# Patient Record
Sex: Female | Born: 1955 | Race: White | Hispanic: No | Marital: Married
Health system: Midwestern US, Community
[De-identification: ages and names within clinical notes are randomized; demographics above are authoritative.]

## PROBLEM LIST (undated history)

## (undated) DIAGNOSIS — R918 Other nonspecific abnormal finding of lung field: Secondary | ICD-10-CM

## (undated) DIAGNOSIS — R1013 Epigastric pain: Secondary | ICD-10-CM

## (undated) DIAGNOSIS — K769 Liver disease, unspecified: Principal | ICD-10-CM

## (undated) DIAGNOSIS — L299 Pruritus, unspecified: Secondary | ICD-10-CM

## (undated) DIAGNOSIS — G629 Polyneuropathy, unspecified: Secondary | ICD-10-CM

## (undated) DIAGNOSIS — R911 Solitary pulmonary nodule: Secondary | ICD-10-CM

## (undated) DIAGNOSIS — R0609 Other forms of dyspnea: Secondary | ICD-10-CM

## (undated) DIAGNOSIS — E7800 Pure hypercholesterolemia, unspecified: Secondary | ICD-10-CM

## (undated) DIAGNOSIS — E278 Other specified disorders of adrenal gland: Secondary | ICD-10-CM

---

## 2014-04-15 ENCOUNTER — Inpatient Hospital Stay
Admit: 2014-04-15 | Discharge: 2014-04-15 | Disposition: A | Discharge status: Home / Self Care | Payer: PRIVATE HEALTH INSURANCE | Attending: Emergency Medicine

## 2014-04-15 DIAGNOSIS — R51 Headache: Secondary | ICD-10-CM

## 2014-04-15 LAB — METABOLIC PANEL, COMPREHENSIVE
A-G Ratio: 1.3 (ref 1.1–2.2)
ALT (SGPT): 21 U/L (ref 12–78)
AST (SGOT): 24 U/L (ref 15–37)
Albumin: 4.2 g/dL (ref 3.5–5.0)
Alk. phosphatase: 96 U/L (ref 45–117)
Anion gap: 8 mmol/L (ref 5–15)
BUN/Creatinine ratio: 19 (ref 12–20)
BUN: 20 MG/DL (ref 6–20)
Bilirubin, total: 0.4 MG/DL (ref 0.2–1.0)
CO2: 24 mmol/L (ref 21–32)
Calcium: 8.9 MG/DL (ref 8.5–10.1)
Chloride: 107 mmol/L (ref 97–108)
Creatinine: 1.08 MG/DL — ABNORMAL HIGH (ref 0.55–1.02)
GFR est AA: >60 mL/min/{1.73_m2} (ref >60)
GFR est non-AA: 52 mL/min/{1.73_m2} — ABNORMAL LOW (ref >60)
Globulin: 3.3 g/dL (ref 2.0–4.0)
Glucose: 77 mg/dL (ref 65–100)
Potassium: 4.3 mmol/L (ref 3.5–5.1)
Protein, total: 7.5 g/dL (ref 6.4–8.2)
Sodium: 139 mmol/L (ref 136–145)

## 2014-04-15 LAB — CBC WITH AUTOMATED DIFF
ABS. BASOPHILS: 0.1 10*3/uL (ref 0.0–0.1)
ABS. EOSINOPHILS: 0.3 10*3/uL (ref 0.0–0.4)
ABS. LYMPHOCYTES: 2.2 10*3/uL (ref 0.8–3.5)
ABS. MONOCYTES: 0.6 10*3/uL (ref 0.0–1.0)
ABS. NEUTROPHILS: 5.5 10*3/uL (ref 1.8–8.0)
BASOPHILS: 1 % (ref 0–1)
EOSINOPHILS: 3 % (ref 0–7)
HCT: 44.2 % (ref 35.0–47.0)
HGB: 14.5 g/dL (ref 11.5–16.0)
LYMPHOCYTES: 25 % (ref 12–49)
MCH: 30.7 PG (ref 26.0–34.0)
MCHC: 32.8 g/dL (ref 30.0–36.5)
MCV: 93.4 FL (ref 80.0–99.0)
MONOCYTES: 7 % (ref 5–13)
NEUTROPHILS: 64 % (ref 32–75)
PLATELET: 278 10*3/uL (ref 150–400)
RBC: 4.73 M/uL (ref 3.80–5.20)
RDW: 12.9 % (ref 11.5–14.5)
WBC: 8.6 10*3/uL (ref 3.6–11.0)

## 2014-04-15 NOTE — ED Provider Notes (Signed)
HPI Comments: 59 y.o. female with past medical history significant for arthritis and pancreatitis who presents from home with chief complaint of headache.  Pt states sudden onset "four days ago" of episodic HA.  Pt reports having sharp R-sided HA that radiates to her R-neck and R-face.  Pt reports having worsened pain at night.  Pt states she has been taking Augmentin ("started yesterday"), anti-histamine, and using a NetiPot with minimal relief.   Pt states that her vision feels "distant" and she has some "facial dryness" with accompanying forehead redness.  Pt states these symptoms are new and not previously experienced in the past.  Pt denies having sore throat, cough, nausea, vomiting, or diarrhea.  There are no other acute medical concerns at this time.    Social hx: Tobacco: No, Alcohol: No, Occupation: Procedure nurse at [employer]    PCP: No primary care provider on file.    Note written by Juan Quam. Philippi, Neurosurgeon, as dictated by Thornton Papas, MD 4:45 PM          The history is provided by the patient.        Past Medical History:   Diagnosis Date   ??? Arthritis    ??? Pancreatitis      when pt was 59 y.o       Past Surgical History:   Procedure Laterality Date   ??? Hx knee arthroscopy Right    ??? Hx partial hysterectomy     ??? Hx tonsillectomy     ??? Hx wisdom teeth extraction           History reviewed. No pertinent family history.    History     Social History   ??? Marital Status: SINGLE     Spouse Name: N/A     Number of Children: N/A   ??? Years of Education: N/A     Occupational History   ??? Not on file.     Social History Main Topics   ??? Smoking status: Never Smoker    ??? Smokeless tobacco: Not on file   ??? Alcohol Use: No   ??? Drug Use: Not on file   ??? Sexual Activity: Not on file     Other Topics Concern   ??? Not on file     Social History Narrative   ??? No narrative on file           ALLERGIES: Doxycycline and Macrobid      Review of Systems    Constitutional: Negative for fever, chills and diaphoresis.   HENT: Negative for congestion, postnasal drip, rhinorrhea and sore throat.         Positive for facial "dryness".   Eyes: Positive for visual disturbance ("distant"). Negative for photophobia, discharge and redness.   Respiratory: Negative for cough, chest tightness, shortness of breath and wheezing.    Cardiovascular: Negative for chest pain, palpitations and leg swelling.   Gastrointestinal: Negative for nausea, vomiting, abdominal pain, diarrhea, constipation, blood in stool and abdominal distention.   Genitourinary: Negative for dysuria, urgency, frequency, hematuria and difficulty urinating.   Musculoskeletal: Negative for myalgias, back pain, joint swelling and arthralgias.   Skin: Negative for color change and rash.   Neurological: Positive for headaches. Negative for dizziness, speech difficulty, weakness, light-headedness and numbness.   Psychiatric/Behavioral: Negative for confusion. The patient is not nervous/anxious.    All other systems reviewed and are negative.      Filed Vitals:    04/15/14 1620  BP: 162/96   Pulse: 95   Temp: 97.8 ??F (36.6 ??C)   Resp: 16   Height: 5\' 8"  (1.727 m)   Weight: 70.308 kg (155 lb)   SpO2: 97%            Physical Exam   Constitutional: She is oriented to person, place, and time. She appears well-developed and well-nourished. No distress.   HENT:   Head: Normocephalic and atraumatic.   Right Ear: External ear normal.   Left Ear: External ear normal.   Nose: Nose normal.   Mouth/Throat: Oropharynx is clear and moist.   Eyes: Conjunctivae and EOM are normal. Pupils are equal, round, and reactive to light. No scleral icterus.   Neck: Neck supple. No JVD present. No tracheal deviation present. No thyromegaly present.   Cardiovascular: Normal rate, regular rhythm and normal heart sounds.  Exam reveals no gallop and no friction rub.    No murmur heard.   Pulmonary/Chest: Effort normal and breath sounds normal. No respiratory distress. She has no wheezes. She has no rales. She exhibits no tenderness.   Abdominal: Soft. Bowel sounds are normal. She exhibits no distension and no mass. There is no tenderness. There is no rebound and no guarding.   Musculoskeletal: Normal range of motion. She exhibits no edema or tenderness.   Lymphadenopathy:     She has no cervical adenopathy.   Neurological: She is alert and oriented to person, place, and time. No cranial nerve deficit. Coordination normal.   Skin: Skin is warm and dry. No rash noted. She is not diaphoretic. No erythema.   Psychiatric: She has a normal mood and affect. Her behavior is normal. Judgment and thought content normal.   Nursing note and vitals reviewed.  Note written by Juan QuamZachary A. Philippi, Neurosurgeoncribe, as dictated by Thornton PapasLuis F Ismail Graziani, MD 4:45 PM       MDM  Number of Diagnoses or Management Options  Acute nonintractable headache, unspecified headache type:   Cervical paraspinal muscle spasm:   Diagnosis management comments: Madia:  Patient he'll female presenting to the emergency department with a headache more so on the right side radiating down into her neck however not in the posterior aspect bilaterally. She describes a subjective visual disturbance R. there has been no fever chills, nausea vomiting, numbness weakness or paresthesias. She has tried over-the-counter remedies for sinusitis including the neddy pot, she has a prescription for Augmentin which was left over and she started taking this yesterday.    The differential includes sinusitis, doubt intracerebral or vascular event,    Plan of care will be baseline labs to include a sedimentation rate and a head CT scan.      Procedures

## 2014-04-15 NOTE — ED Notes (Signed)
C/o sharp pains radiating from right side of head into neck that started Sunday.  C/o sensitive face and scalp to touch. C/o reddened area to center forehead into scalp as well.

## 2014-04-15 NOTE — ED Notes (Signed)
Sent ordered labs.  Pt resting comfortably, no concerns at this time.

## 2014-04-15 NOTE — ED Notes (Signed)
Pt verbalized understanding of discharge instructions.  Pt ambulated off unit.

## 2014-04-15 NOTE — ED Notes (Signed)
Pt comes to ED with report of headache that radiates down to the right side of her neck, onset this past Sunday.  Pt also reports areas of redness on forehead.

## 2014-04-16 LAB — SED RATE (ESR): Sed rate, automated: 5 mm/hr (ref 0–30)

## 2014-04-16 MED ORDER — METHOCARBAMOL 750 MG TAB
750 mg | ORAL_TABLET | Freq: Four times a day (QID) | ORAL | Status: AC
Start: 2014-04-16 — End: 2014-04-20

## 2014-04-16 MED ORDER — TRAMADOL 50 MG TAB
50 mg | ORAL_TABLET | Freq: Four times a day (QID) | ORAL | Status: DC | PRN
Start: 2014-04-16 — End: 2016-03-06

## 2015-01-27 ENCOUNTER — Ambulatory Visit: Admit: 2015-01-27 | Payer: PRIVATE HEALTH INSURANCE | Attending: Neurology

## 2015-01-27 DIAGNOSIS — R413 Other amnesia: Secondary | ICD-10-CM

## 2015-01-27 MED ORDER — GABAPENTIN 100 MG CAP
100 mg | ORAL_CAPSULE | Freq: Every evening | ORAL | 5 refills | Status: DC
Start: 2015-01-27 — End: 2015-07-28

## 2015-01-27 NOTE — Progress Notes (Signed)
NEUROLOGY NEW PATIENT CONSULTATION    REFERRED BY:  Adine Madura, MD    CHIEF COMPLAINT:  Memory loss    HISTORY OF PRESENT ILLNESS    HISTORY PROVIDED BY:  Patient    Suzanne Norman is a 59 y.o. female who I am asked to see in consultation for memory loss. She says through the years she has had issues with this. She reports her mom had this issue when she was in her 32s. She says she is in a panic while trying to do her job. She can't remember names. She feels like she has attention deficit. She feels like she can't see the details. She did try ADD meds but couldn't tolerate this in the past.    She does all ADLs. Remembering names is not quick anymore.    She is driving. She reports she gets lost easily. She needs to stay in routine places for her.   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 She has had issues all her life. She is doing fine at work and no one has noticed an issue.   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 She has pain and the desire to itch it. Neurontin was suggested but she hasn't tried it. She reports this all the time and it will make her want to cry or scream.   She just CTS on left by Dr. Pam Drown.   She just had labs done today at [place]. She got B12, TSH, and autoimmune labs.   FH of autoimmune issues.    CT head Jan 2016 due to head pain. Her headaches have improved.   She reports severe pain in the legs with lactic acid buildup after exercise.       PMH  Past Medical History   Diagnosis Date   ??? Arthritis    ??? Pancreatitis      when pt was 59 y.o       [place]  Social History     Social History   ??? Marital status: MARRIED     Spouse name: N/A   ??? Number of children: N/A   ??? Years of education: N/A     Social History Main Topics   ??? Smoking status: Never Smoker   ??? Smokeless tobacco: None   ??? Alcohol use No   ??? Drug use: None   ??? Sexual activity: Not Asked     Other Topics Concern   ??? None     Social History Narrative       FH   History reviewed. No pertinent family history.    ALLERGIES  Allergies   Allergen Reactions   ??? Doxycycline Vertigo   ??? Macrobid [Nitrofurantoin Monohyd/M-Cryst] Nausea and Vomiting       CURRENT MEDS  Current Outpatient Prescriptions   Medication Sig Dispense Refill   ??? naproxen sodium (ALEVE) 220 mg tablet Take 500 mg by mouth as needed.     ??? FEXOFENADINE HCL (WAL-FEX ALLERGY PO) Take  by mouth daily as needed.     ??? traMADol (ULTRAM) 50 mg tablet Take 1 Tab by mouth every six (6) hours as needed for Pain. Max Daily Amount: 200 mg. 20 Tab 0       REVIEW OF SYSTEMS:     Y  N       Y  N  Y  N   Y  N    AIDS  Falls    Memory Loss     Shortness of breath    Anxiety            Fatigue   Muscle Pain           Skipped beats    Chest Pain     Frequent HA   Ms Weakness        Snoring    Constipation  Hearing loss   Nause/Vomiting     Stomach Pain    Cough           Hepatitis   Neuropathy            Swallowing difficulty    Depression   Incontinence   Poor appetite         Vertigo    Diarrhea         Joint Pain   Rash                      Visual disturbances    Fainting          Leg Swelling   Ringing ears          Weight changes      Unable to obtain  ROS due to  mental status change  sedated   intubated          PREVIOUS WORKUP  IMAGING: CT head: Negative  (I personally reviewed these images in PACS and this is my impression)    LABS  Results for orders placed or performed during the hospital encounter of 04/15/14   CBC WITH AUTOMATED DIFF   Result Value Ref Range    WBC 8.6 3.6 - 11.0 K/uL    RBC 4.73 3.80 - 5.20 M/uL    HGB 14.5 11.5 - 16.0 g/dL    HCT 44.2 35.0 - 47.0 %    MCV 93.4 80.0 - 99.0 FL    MCH 30.7 26.0 - 34.0 PG    MCHC 32.8 30.0 - 36.5 g/dL    RDW 12.9 11.5 - 14.5 %    PLATELET 278 150 - 400 K/uL    NEUTROPHILS 64 32 - 75 %    LYMPHOCYTES 25 12 - 49 %    MONOCYTES 7 5 - 13 %    EOSINOPHILS 3 0 - 7 %    BASOPHILS 1 0 - 1 %    ABS. NEUTROPHILS 5.5 1.8 - 8.0 K/UL     ABS. LYMPHOCYTES 2.2 0.8 - 3.5 K/UL    ABS. MONOCYTES 0.6 0.0 - 1.0 K/UL    ABS. EOSINOPHILS 0.3 0.0 - 0.4 K/UL    ABS. BASOPHILS 0.1 0.0 - 0.1 K/UL   METABOLIC PANEL, COMPREHENSIVE   Result Value Ref Range    Sodium 139 136 - 145 mmol/L    Potassium 4.3 3.5 - 5.1 mmol/L    Chloride 107 97 - 108 mmol/L    CO2 24 21 - 32 mmol/L    Anion gap 8 5 - 15 mmol/L    Glucose 77 65 - 100 mg/dL    BUN 20 6 - 20 MG/DL    Creatinine 1.08 (H) 0.55 - 1.02 MG/DL    BUN/Creatinine ratio 19 12 - 20      GFR est AA >60 >60 ml/min/1.52m    GFR est non-AA 52 (L) >60 ml/min/1.761m   Calcium 8.9 8.5 - 10.1 MG/DL    Bilirubin, total 0.4 0.2 - 1.0 MG/DL    ALT 21  12 - 78 U/L    AST 24 15 - 37 U/L    Alk. phosphatase 96 45 - 117 U/L    Protein, total 7.5 6.4 - 8.2 g/dL    Albumin 4.2 3.5 - 5.0 g/dL    Globulin 3.3 2.0 - 4.0 g/dL    A-G Ratio 1.3 1.1 - 2.2     SED RATE (ESR)   Result Value Ref Range    Sed rate, automated 5 0 - 30 mm/hr       PHYSICAL EXAM  Visit Vitals   ??? BP 140/70   ??? Pulse 84   ??? Ht 5' 8"  (1.727 m)   ??? Wt 152 lb (68.9 kg)   ??? SpO2 98%   ??? BMI 23.11 kg/m2     General:  Alert, cooperative, no distress.   Head:  Normocephalic, without obvious abnormality, atraumatic.   Eyes:  Conjunctivae/corneas clear. Pupils equal, round, reactive to light. Extraocular movements intact, VFF, NO papilledema   Lungs:  Heart:   Non labored breathing  Regular rate and rhythm, no carotid bruits   Abdomen:   Soft, non-distended   Extremities: Extremities normal, atraumatic, no cyanosis or edema.   Pulses: 2+ and symmetric all extremities.   Skin: Skin color, texture, turgor normal. No rashes or lesions.   Neurologic:  Gen: Attention normal             Language: naming, repetition, fluency normal             Memory: intact recent and remote memory  Cranial Nerves:  I: smell Not tested   II: visual fields Full to confrontation   II: pupils Equal, round, reactive to light   II: optic disc No papilledema   III,VII: ptosis none    III,IV,VI: extraocular muscles  Full ROM   V: mastication normal   V: facial light touch sensation  normal   VII: facial muscle function   symmetric   VIII: hearing symmetric   IX: soft palate elevation  normal   XI: trapezius strength  5/5   XI: sternocleidomastoid strength 5/5   XI: neck flexion strength  5/5   XII: tongue  midline     Motor: normal bulk and tone, no tremor              Strength: 5/5 all four extremities, except 4/5 LUE  Sensory: intact to LT, PP, vibration, and temperature  Coordination: FTN intact, Rhomberg negative  Gait: normal gait including tandem   Reflexes: 2+ throughout       IMPRESSION  Suzanne Norman is a 59 y.o. female who presents for evaluation of memory loss. She does have history of ADD. She also has family history of dementia. Will do eval with MRI and neuropsych. Will wait for labs ordered by PCP.    RECOMMENDATIONS  1. Referral to neuropsych  2. MRI brain w/wo contrast  3. Discussed medications  4. No labs needed but she will send [place] the labs she has gotten today (B12, TSH, CPK, autoimmune labs, Lyme)  5. Start gabapentin 100 mg daily at bedtime for nerve pain  6. Discussed dementia versus ADD    FU after neuropsych testing    Janan Ridge, MD    CC: Adine Madura, MD  Fax: [phone]     This note will not be viewable in [place].

## 2015-01-27 NOTE — Patient Instructions (Signed)
PRESCRIPTION REFILL POLICY    [facility]   Statement to Patients  June 24, 2012      In an effort to ensure the large volume of patient prescription refills is processed in the most efficient and expeditious manner, we are asking our patients to assist us by calling your Pharmacy for all prescription refills, this will include also your  Mail Order Pharmacy. The pharmacy will contact our office electronically to continue the refill process.    Please do not wait until the last minute to call your pharmacy. We need at least 48 hours (2days) to fill prescriptions. We also encourage you to call your pharmacy before going to pick up your prescription to make sure it is ready.     With regard to controlled substance prescription refill requests (narcotic refills) that need to be picked up at our office, we ask your cooperation by providing us with at least 72 hours (3days) notice that you will need a refill.    We will not refill narcotic prescription refill requests after 4:00pm on any weekday, Monday through Thursday, or after 2:00pm on Fridays, or on the weekends.      We encourage everyone to explore another way of getting your prescription refill request processed using MyChart, our patient web portal through our electronic medical record system. MyChart is an efficient and effective way to communicate your medication request directly to the office and  downloadable as an app on your smart phone . MyChart also features a review functionality that allows you to view your medication list as well as leave messages for your physician. Are you ready to get connected? If so please review the attatched instructions or speak to any of our staff to get you set up right away!    Thank you so much for your cooperation. Should you have any questions please contact our Practice Administrator.    The Physicians and Staff,  [facility]

## 2015-01-27 NOTE — Progress Notes (Signed)
Referral faxed to Dr. Khawaja

## 2015-02-04 NOTE — Telephone Encounter (Signed)
Patient contact her insr and Dr.Khawaja is out of network, pt would like the name of another doctor in her network, please call pt

## 2015-02-04 NOTE — Telephone Encounter (Signed)
Left message for patient to contact her insurance company and see what Neuropsych physician they will cover and call us with that name

## 2015-02-07 ENCOUNTER — Ambulatory Visit: Payer: PRIVATE HEALTH INSURANCE

## 2015-02-14 NOTE — Telephone Encounter (Signed)
-----   Message from Renda Rollsichard R Barnett sent at 02/14/2015 11:03 AM EST -----  Pt's mri is denied. Please let me know if by chance the doctor would like to do a peer to peer. If I don't hear anything by 2:00 I will take the pt off the schedule. Thanks!   Richard    [phone], denied per [employer] website, reason for denial - Based on   [employer] Guidelines, we are unable to approve the   requested study. Guidelines support advanced imaging after there has   been a clinical diagnosis of dementia. A diagnosis can be made by a   detailed history documenting memory loss and cognitive impairment   affecting daily activities, and/or an abnormal mental status exam   such as the Mini Mental Status Exam, Montreal Cognitive Assessment,   Memory Impairment Screen, or other neuropsychological testing. The   clinical information provided does not meet these criteria and,   therefore, advanced imaging is not indicated at this time. for peer   to peer md can call [phone] and reference the case number   [id number]

## 2015-02-14 NOTE — Telephone Encounter (Signed)
I'm sorry , Dr. Piedad Climesodden does not do Peer to peer

## 2015-02-14 NOTE — Telephone Encounter (Signed)
Dr. Piedad Climesodden does not do peer to peer.

## 2015-02-14 NOTE — Telephone Encounter (Signed)
-----   Message from Richard R Barnett sent at 02/14/2015 11:03 AM EST -----  Pt's mri is denied. Please let me know if by chance the doctor would like to do a peer to peer. If I don't hear anything by 2:00 I will take the pt off the schedule. Thanks!   Richard    [id number], denied per [employer] website, reason for denial - Based on   [employer] Guidelines, we are unable to approve the   requested study. Guidelines support advanced imaging after there has   been a clinical diagnosis of dementia. A diagnosis can be made by a   detailed history documenting memory loss and cognitive impairment   affecting daily activities, and/or an abnormal mental status exam   such as the Mini Mental Status Exam, Montreal Cognitive Assessment,   Memory Impairment Screen, or other neuropsychological testing. The   clinical information provided does not meet these criteria and,   therefore, advanced imaging is not indicated at this time. for peer   to peer md can call [phone] and reference the case number   [id number]

## 2015-02-15 ENCOUNTER — Inpatient Hospital Stay: Payer: PRIVATE HEALTH INSURANCE | Attending: Neurology

## 2015-05-25 ENCOUNTER — Encounter: Attending: Clinical Neuropsychologist

## 2015-07-28 MED ORDER — GABAPENTIN 100 MG CAP
100 mg | ORAL_CAPSULE | ORAL | 5 refills | Status: DC
Start: 2015-07-28 — End: 2016-09-04

## 2016-03-06 ENCOUNTER — Encounter: Admit: 2016-03-06 | Discharge: 2016-03-06 | Payer: PRIVATE HEALTH INSURANCE

## 2016-03-06 ENCOUNTER — Ambulatory Visit: Admit: 2016-03-06 | Payer: PRIVATE HEALTH INSURANCE | Attending: Internal Medicine

## 2016-03-06 ENCOUNTER — Encounter: Admit: 2016-03-06

## 2016-03-06 DIAGNOSIS — Z0000 Encounter for general adult medical examination without abnormal findings: Secondary | ICD-10-CM

## 2016-03-06 NOTE — Progress Notes (Signed)
Images obtained

## 2016-03-06 NOTE — Progress Notes (Signed)
HISTORY OF PRESENT ILLNESS  Suzanne Norman is a 60 y.o. female.  HPI   Pt is here to establish care.    BP is 138/87    Wt is 163 lbs today  Discussed diet and weight loss     Pt had labs completed in 2016  Will get labs today    Pt follows with Dr. 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    Pt c/o one episode of tinnitus   However, her sx are not constant     Pt c/o SOB when exercising  Pt states that "there is no breath" when swimming, talking, jogging or lifting weights  Pt had labs completed for her sx in 2016  Per pt, [facility] in 2017 was unremarkable   Pt states that she completed a stress test, as she had some PVCs  Will get EKG  Ordered CXR  Ordered stress-ECHO    Pt c/o some hypoglycemic episodes  Pt states that she gets "a low BS feeling"  Pt attributes her feeling to eating too many sweets    Pt c/o pain and vaginal tears during sexual intercourse  Her gyn is monitoring her for now    Will have her sign a release for her records     PMHx:  Arthritis  Pancreatitis     FMHx:  Father - tonsil, liver, pancreatic and colon cancer, heart issue with bypass surgery, possibly HTN and hypercholesterolemia  Mother - HTN, glaucoma    PSHx:  Knee arthroscopy  Partial hysterectomy with ovaries  Joint replacement for carpal tunnel in L hand  Breast lift  Tonsillectomy   Wisdom teeth extraction    SHx:  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 Elmon Kirschner, will get notes for review, [facility] colon cancer (father), poylps in both parents  Pap: 12/17, Dr. Robby Sermon  Mammogram: Summer 2017, Dr. Robby Sermon at [facility]  Dexa: not yet needed  Tdap: 03/06/16   Pneumovax: not yet needed  Zostavax: 01/25/16  Flu shot: 01/25/16   Eye exam: due, advised to schedule  Lipids: ordered 03/06/16   EKG: 03/06/16, nsr     There are no active problems to display for this patient.    Current Outpatient Prescriptions   Medication Sig Dispense Refill   ??? gabapentin (NEURONTIN) 100 mg capsule TAKE 3 CAPSULES BY MOUTH EVERY EVENING (Patient taking differently: TAKE 2 CAPSULES BY MOUTH EVERY EVENING) 90 Cap 5   ??? FEXOFENADINE HCL (WAL-FEX ALLERGY PO) Take  by mouth daily as needed.     ??? naproxen sodium (ALEVE) 220 mg tablet Take 500 mg by mouth as needed.     ??? traMADol (ULTRAM) 50 mg tablet Take 1 Tab by mouth every six (6) hours as needed for Pain. Max Daily Amount: 200 mg. 20 Tab 0     Past Surgical History:   Procedure Laterality Date   ??? HX KNEE ARTHROSCOPY Right    ??? HX KNEE ARTHROSCOPY     ??? HX PARTIAL HYSTERECTOMY     ??? HX TONSILLECTOMY     ??? HX WISDOM TEETH EXTRACTION  Lab Results  Component Value Date/Time   WBC 8.6 04/15/2014 04:36 PM   HGB 14.5 04/15/2014 04:36 PM   HCT 44.2 04/15/2014 04:36 PM   PLATELET 278 04/15/2014 04:36 PM   MCV 93.4 04/15/2014 04:36 PM     No results found for: CHOL, CHOLPOCT, HDL, LDL, LDLC, LDLCPOC, LDLCEXT, TRIGL, TGLPOCT, CHHD, CHHDX  Lab Results  Component Value Date/Time   GFR est non-AA 52 04/15/2014 04:36 PM   GFR est AA >60 04/15/2014 04:36 PM   Creatinine 1.08 04/15/2014 04:36 PM   BUN 20 04/15/2014 04:36 PM   Sodium 139 04/15/2014 04:36 PM   Potassium 4.3 04/15/2014 04:36 PM   Chloride 107 04/15/2014 04:36 PM   CO2 24 04/15/2014 04:36 PM          Review of Systems   Constitutional: Positive for malaise/fatigue. Negative for chills and fever.   HENT: Positive for tinnitus. Negative for hearing loss.    Eyes: Negative for blurred vision and double vision.   Respiratory: Positive for shortness of breath. Negative for wheezing.    Cardiovascular: Negative for chest pain and palpitations.   Gastrointestinal: Negative for nausea and vomiting.   Genitourinary: Negative for dysuria and frequency.    Musculoskeletal: Negative for back pain and falls.   Skin: Negative for itching and rash.   Neurological: Negative for dizziness, loss of consciousness and headaches.   Psychiatric/Behavioral: Negative for depression. The patient is not nervous/anxious.        Physical Exam   Constitutional: She is oriented to person, place, and time. She appears well-developed and well-nourished. No distress.   HENT:   Head: Normocephalic and atraumatic.   Right Ear: External ear normal.   Left Ear: External ear normal.   Mouth/Throat: Oropharynx is clear and moist. No oropharyngeal exudate.   Eyes: Conjunctivae and EOM are normal. Pupils are equal, round, and reactive to light. Right eye exhibits no discharge. Left eye exhibits no discharge. No scleral icterus.   Neck: Normal range of motion. Neck supple.   No carotid bruits    Cardiovascular: Normal rate, regular rhythm and normal heart sounds.  Exam reveals no gallop and no friction rub.    No murmur heard.  Pulmonary/Chest: Effort normal and breath sounds normal. No respiratory distress. She has no wheezes. She has no rales. She exhibits no tenderness.   Abdominal: Soft. She exhibits no distension and no mass. There is no tenderness. There is no rebound and no guarding.   Musculoskeletal: Normal range of motion. She exhibits no edema, tenderness or deformity.   Lymphadenopathy:     She has no cervical adenopathy.   Neurological: She is alert and oriented to person, place, and time. Coordination normal.   Skin: Skin is warm and dry. No rash noted. She is not diaphoretic. No erythema. No pallor.   Psychiatric: She has a normal mood and affect. Her behavior is normal.       ASSESSMENT and PLAN    ICD-10-CM ICD-9-CM    1. Physical exam, annual    Pt here for routine eval, will get basic labs, nl wt and nl BP, flu and shingles vaccines are UTD, Tdap given today, EKG nsr, COLO, mammo and pelvic UTD, eye exam due, advised her to schedule   Z00.00 V70.0 LIPID PANEL       METABOLIC PANEL, COMPREHENSIVE      CBC W/O DIFF      HEMOGLOBIN A1C WITH EAG      TSH 3RD GENERATION  URINALYSIS W/ RFLX MICROSCOPIC      HEPATITIS C AB      AMB POC EKG ROUTINE W/ 12 LEADS, INTER & REP      XR CHEST PA LAT   2. DOE (dyspnea on exertion)    EKG unremarkable, checking basic labs, will get a CXR to begin eval and stress ECHO [facility], ultimately if this is unrevealing may need pulm eval for continued eval    R06.09 786.09 ECHO EXERCISE STRESS      Depression screen reviewed and negative.     Scribed by Octaviano Glowhrista Lyons of [employer], as dictated by Dr. Bronwen BettersJennifer G. Cullan Launer.    Current diagnosis and concerns discussed with pt at length. Pt understands risks and benefits or current treatment plan and medications, and accepts the treatment and medication with any possible risks. Pt asks appropriate questions, which were answered. Pt was instructed to call with any concerns or problems.   This note will not be viewable in MyChart.

## 2016-03-06 NOTE — Addendum Note (Signed)
Addended by: Army ChacoATE, Helaman Mecca D on: 03/06/2016 09:51 AM      Modules accepted: Orders, SmartSet

## 2016-03-06 NOTE — Progress Notes (Signed)
Sent letter

## 2016-03-07 LAB — URINALYSIS W/ RFLX MICROSCOPIC
Bilirubin: NEGATIVE
Blood: NEGATIVE
Glucose: NEGATIVE
Ketone: NEGATIVE
Leukocyte Esterase: NEGATIVE
Nitrites: NEGATIVE
Protein: NEGATIVE
Specific Gravity: 1.025 (ref 1.005–1.030)
Urobilinogen: 0.2 mg/dL (ref 0.2–1.0)
pH (UA): 5.5 (ref 5.0–7.5)

## 2016-03-07 LAB — LIPID PANEL
Cholesterol, total: 261 mg/dL — ABNORMAL HIGH (ref 100–199)
HDL Cholesterol: 57 mg/dL (ref >39)
LDL, calculated: 171 mg/dL — ABNORMAL HIGH (ref 0–99)
Triglyceride: 165 mg/dL — ABNORMAL HIGH (ref 0–149)
VLDL, calculated: 33 mg/dL (ref 5–40)

## 2016-03-07 LAB — METABOLIC PANEL, COMPREHENSIVE
A-G Ratio: 2 (ref 1.2–2.2)
ALT (SGPT): 7 IU/L (ref 0–32)
AST (SGOT): 17 IU/L (ref 0–40)
Albumin: 4.5 g/dL (ref 3.6–4.8)
Alk. phosphatase: 77 IU/L (ref 39–117)
BUN/Creatinine ratio: 27 (ref 12–28)
BUN: 23 mg/dL (ref 8–27)
Bilirubin, total: 0.4 mg/dL (ref 0.0–1.2)
CO2: 24 mmol/L (ref 18–29)
Calcium: 9.4 mg/dL (ref 8.7–10.3)
Chloride: 104 mmol/L (ref 96–106)
Creatinine: 0.85 mg/dL (ref 0.57–1.00)
GFR est AA: 86 mL/min/{1.73_m2} (ref >59)
GFR est non-AA: 74 mL/min/{1.73_m2} (ref >59)
GLOBULIN, TOTAL: 2.2 g/dL (ref 1.5–4.5)
Glucose: 81 mg/dL (ref 65–99)
Potassium: 4.4 mmol/L (ref 3.5–5.2)
Protein, total: 6.7 g/dL (ref 6.0–8.5)
Sodium: 145 mmol/L — ABNORMAL HIGH (ref 134–144)

## 2016-03-07 LAB — CBC W/O DIFF
HCT: 40.5 % (ref 34.0–46.6)
HGB: 13.8 g/dL (ref 11.1–15.9)
MCH: 30.6 pg (ref 26.6–33.0)
MCHC: 34.1 g/dL (ref 31.5–35.7)
MCV: 90 fL (ref 79–97)
PLATELET: 274 10*3/uL (ref 150–379)
RBC: 4.51 x10E6/uL (ref 3.77–5.28)
RDW: 13.4 % (ref 12.3–15.4)
WBC: 7 10*3/uL (ref 3.4–10.8)

## 2016-03-07 LAB — HEPATITIS C AB: Hep C Virus Ab: <0.1 s/co ratio (ref 0.0–0.9)

## 2016-03-07 LAB — TSH 3RD GENERATION: TSH: 1.24 u[IU]/mL (ref 0.450–4.500)

## 2016-03-07 LAB — HEMOGLOBIN A1C WITH EAG
Estimated average glucose: 105 mg/dL
Hemoglobin A1c: 5.3 % (ref 4.8–5.6)

## 2016-03-07 LAB — HEPATITIS C ANTIBODY: HCV Ab: <0.1 s/co ratio (ref 0.0–0.9)

## 2016-03-07 NOTE — Progress Notes (Signed)
Let her know that labs showed elevated cholesterol can work on diet but given level of elevation would consider starting lipitor 20mg  daily for high cholesterol    Repeat lipids, cmp  in 6 weeks    Rest labs good

## 2016-03-08 ENCOUNTER — Telehealth

## 2016-03-08 NOTE — Progress Notes (Signed)
Called, left vm for pt to return call to office.

## 2016-03-08 NOTE — Telephone Encounter (Signed)
Patient states to return her call after 3 pm today. Patient is returning your call from this morning. Thank you

## 2016-03-09 NOTE — Progress Notes (Signed)
Called, left vm for pt to return call to office.  2nd attempt

## 2016-03-09 NOTE — Progress Notes (Signed)
Called, spoke to pt.  Two pt identifiers confirmed.   Pt informed per Dr. Katrinka BlazingSmith labs showed elevated cholesterol can work on diet but given level of elevation would consider starting lipitor 20mg  daily for high cholesterol-pt would like to do diet first.  Pt informed repeat fasting labs in 6wks. Labs ordered and mailed to pt.    Pt informed rest of labs stable.   Pt verbalized understanding of information discussed w/ no further questions at this time.

## 2016-03-09 NOTE — Telephone Encounter (Signed)
Called, left vm for pt to return call to office.

## 2016-03-09 NOTE — Telephone Encounter (Signed)
Patient states she's returning your call. Please call. Thank you

## 2016-03-09 NOTE — Telephone Encounter (Signed)
Called, spoke to pt.  Two pt identifiers confirmed.   Pt informed of lab results. See recent lab result encounter.

## 2016-03-14 ENCOUNTER — Telehealth

## 2016-03-14 NOTE — Telephone Encounter (Signed)
#  [phone] pt would like a call to get a referral to cardiology to get the stress test.  It would be much cheaper than having the test done at the hospital.  Please call pt as she will need a referral to do this.     Dr. Meredith Modyhristine Browning is who she was told to see? Please call pt

## 2016-03-14 NOTE — Telephone Encounter (Signed)
Left vm on pt's home phone.

## 2016-03-14 NOTE — Telephone Encounter (Signed)
Called, spoke to pt.  Two pt identifiers confirmed.   Pt asking for a referral to Dr. Dahlia ClientBrowning in order to get stress test.   Pt informed per Dr. Katrinka BlazingSmith referral for Dr. Dahlia ClientBrowning is ok. Mailed to pt.   Pt advised to call back w/ day/time of appt and referral coordinator will complete referral.  Pt verbalized understanding of information discussed w/ no further questions at this time.

## 2016-04-18 ENCOUNTER — Encounter: Admit: 2016-04-18 | Discharge: 2016-04-18 | Payer: PRIVATE HEALTH INSURANCE

## 2016-04-18 ENCOUNTER — Institutional Professional Consult (permissible substitution): Admit: 2016-04-18 | Discharge: 2016-04-18 | Payer: PRIVATE HEALTH INSURANCE

## 2016-04-18 ENCOUNTER — Institutional Professional Consult (permissible substitution)

## 2016-04-18 DIAGNOSIS — R0609 Other forms of dyspnea: Secondary | ICD-10-CM

## 2016-04-18 NOTE — Progress Notes (Signed)
Let her know stress test is normal

## 2016-04-18 NOTE — Progress Notes (Signed)
Stress echo completed.

## 2016-04-19 NOTE — Progress Notes (Signed)
Left a voice message that stress test is normal and if she has any questions call the office.

## 2016-09-03 NOTE — Telephone Encounter (Signed)
-----   Message from Truddie CocoEmily Y Thomas sent at 09/03/2016  8:35 AM EDT -----  Regarding: Dr. Rodden/Rx/Telephone  Pt is requesting a refill on her Rx Gabapentin to be filled at [facility] [place] [facility] ph [phone]. Pt would also like like Dr. Piedad Climesodden to transfer her Rx to her pcp doctor who is Dr[facility]oney [facility]ennifer [facility] ph [phone]. Pt would like a call regarding a refill on her Rx Gabapentin because she will be going out of town on Friday 09-07-2016 and needs the Rx. Pt's best contact number is [phone] (cell)

## 2016-09-03 NOTE — Telephone Encounter (Signed)
That's fine

## 2016-09-03 NOTE — Telephone Encounter (Signed)
Called and spoke with patient. She has not been seen by you since 2016. She has asked if you could refill her Gabapentin for 1 month and then she will have her PCP take over from there. She is going on vacation in the next week and will be out of medication.    Please advise

## 2016-09-04 ENCOUNTER — Encounter

## 2016-09-04 MED ORDER — GABAPENTIN 100 MG CAP
100 mg | ORAL_CAPSULE | ORAL | 0 refills | Status: DC
Start: 2016-09-04 — End: 2016-10-10

## 2016-09-04 NOTE — Telephone Encounter (Signed)
Order placed for Gabapentin, take 3 tablet at night , PO, per Verbal Order from Dr. Piedad Climesodden on 09/04/2016 due to neuropatht.

## 2016-09-04 NOTE — Telephone Encounter (Signed)
LM on VM

## 2016-09-05 MED ORDER — ATORVASTATIN 20 MG TAB
20 mg | ORAL_TABLET | Freq: Every day | ORAL | 3 refills | Status: DC
Start: 2016-09-05 — End: 2017-02-16

## 2016-09-05 NOTE — Telephone Encounter (Signed)
Pended lipitor in refill encounter.  Dr. Piedad Climesodden signed gabapentin refill for pt 09/04/16.

## 2016-09-05 NOTE — Telephone Encounter (Signed)
PCP: Bronwen BettersJennifer G Smith, MD    Last appt: 03/06/2016  No future appointments.    Requested Prescriptions     Pending Prescriptions Disp Refills   ??? atorvastatin (LIPITOR) 20 mg tablet 30 Tab 3     Sig: Take 1 Tab by mouth daily.

## 2016-09-05 NOTE — Telephone Encounter (Signed)
Patient needs a [facility] Their number is [phone]. She would also like the Gabapentin that is normally prescribed by her neurologist Dr. Piedad Climesodden.       Message received & [facility] from [facility]

## 2016-10-10 ENCOUNTER — Telehealth

## 2016-10-10 MED ORDER — GABAPENTIN 100 MG CAP
100 mg | ORAL_CAPSULE | ORAL | 0 refills | Status: DC
Start: 2016-10-10 — End: 2016-12-31

## 2016-10-10 NOTE — Telephone Encounter (Signed)
Yes no problem

## 2016-10-10 NOTE — Telephone Encounter (Signed)
#  [phone] (you may leave a vm) or cell #[phone] lv message.    Pt states she had called some time ago to see if Dr. Katrinka BlazingSmith would take over prescribing the gabapentin for her?  She needs an answer as she can't just stop this medication.  Please call pt today if possible. Thanks.

## 2016-10-10 NOTE — Telephone Encounter (Signed)
Called and spoke to pt.  Two pt identifiers confirmed.   rx sent to pharmacy.  No further questions

## 2016-12-31 ENCOUNTER — Encounter

## 2017-01-01 MED ORDER — GABAPENTIN 100 MG CAP
100 mg | ORAL_CAPSULE | ORAL | 0 refills | Status: DC
Start: 2017-01-01 — End: 2017-02-16

## 2017-01-01 NOTE — Telephone Encounter (Signed)
Due for annual f/u in December please schedule

## 2017-01-15 ENCOUNTER — Encounter

## 2017-01-23 ENCOUNTER — Inpatient Hospital Stay: Admit: 2017-01-23 | Payer: BLUE CROSS/BLUE SHIELD | Attending: Gastroenterology

## 2017-01-23 ENCOUNTER — Encounter

## 2017-01-23 ENCOUNTER — Ambulatory Visit

## 2017-01-23 DIAGNOSIS — R1013 Epigastric pain: Secondary | ICD-10-CM

## 2017-01-23 MED ORDER — SODIUM CHLORIDE 0.9 % IJ SYRG
Freq: Once | INTRAMUSCULAR | Status: AC
Start: 2017-01-23 — End: 2017-01-23
  Administered 2017-01-23: 20:00:00 via INTRAVENOUS

## 2017-01-23 MED ORDER — SODIUM CHLORIDE 0.9 % IV
Freq: Once | INTRAVENOUS | Status: AC
Start: 2017-01-23 — End: 2017-01-23
  Administered 2017-01-23: 20:00:00 via INTRAVENOUS

## 2017-01-23 MED ORDER — BARIUM SULFATE 2 % ORAL SUSP
2.1 % (w/v), 2.0 % (w/w) | Freq: Once | ORAL | Status: AC
Start: 2017-01-23 — End: 2017-01-23
  Administered 2017-01-23: 20:00:00 via ORAL

## 2017-01-23 MED ORDER — IOPAMIDOL 76 % IV SOLN
370 mg iodine /mL (76 %) | Freq: Once | INTRAVENOUS | Status: AC
Start: 2017-01-23 — End: 2017-01-23
  Administered 2017-01-23: 20:00:00 via INTRAVENOUS

## 2017-01-23 MED FILL — ISOVUE-370  76 % INTRAVENOUS SOLUTION: 370 mg iodine /mL (76 %) | INTRAVENOUS | Qty: 100

## 2017-02-11 NOTE — Telephone Encounter (Signed)
#  [phone] pt needs an appt as she keeps getting a call to come in.   Pt has these days she can come in.  Can you accomodate her?     Today, 02-19-17,  Dec 08-06-19   Please call and you may leave a vm to let her know what day and time and she will be in.  Thanks.

## 2017-02-11 NOTE — Telephone Encounter (Signed)
Left detailed vm per pt request to inform of scheduled appt for 03/15/17 1100.  Advised pt to callback if needing to reschedule.  Appointment reminder mailed to pt.

## 2017-02-16 ENCOUNTER — Encounter

## 2017-02-17 MED ORDER — GABAPENTIN 100 MG CAP
100 mg | ORAL_CAPSULE | ORAL | 0 refills | Status: DC
Start: 2017-02-17 — End: 2017-04-04

## 2017-02-17 MED ORDER — ATORVASTATIN 20 MG TAB
20 mg | ORAL_TABLET | ORAL | 0 refills | Status: DC
Start: 2017-02-17 — End: 2017-04-04

## 2017-03-15 ENCOUNTER — Ambulatory Visit: Admit: 2017-03-15 | Payer: PRIVATE HEALTH INSURANCE | Attending: Internal Medicine

## 2017-03-15 DIAGNOSIS — Z0000 Encounter for general adult medical examination without abnormal findings: Secondary | ICD-10-CM

## 2017-03-15 MED ORDER — VARICELLA-ZOSTER GLYCOE VACC-AS01B ADJ(PF) 50 MCG/0.5 ML IM SUSPENSION
50 mcg/0.5 mL | Freq: Once | INTRAMUSCULAR | 1 refills | Status: AC
Start: 2017-03-15 — End: 2017-03-15

## 2017-03-15 NOTE — Progress Notes (Signed)
Send letter

## 2017-03-15 NOTE — Progress Notes (Signed)
HISTORY OF PRESENT ILLNESS  Suzanne Norman is a 61 y.o. female.  HPI  Last here 03/06/16. Pt is here for routine care.  ??  BP is 156/90, will repeat this today  ??  Wt today is 169 lbs --up 6 lbs x lov  Reports dietary indiscretion, states she has been eating sugary things  Discussed that her wt is just on the upper edge of nl  ??  Pt had labs completed in 2016  Will get labs today  ??  Pt follows with Dr. Erlene Senters (derm) for an annual skin check    Since lov, started lipitor  Pt has been cutting this in half and taking 10mg  daily  ??  Pt c/o SOB when exercising  Will get EKG  Ordered CXR  Ordered stress-ECHO  ??  Pt states that she was having intense abd pains  Pt thinks this was caused by her trying to force her BMs out too quickly  Reviewed CT abd 10/18: 1. No acute findings. 2. Indeterminate 10 x 12 mm left adrenal nodule which could be further characterized with dedicated renal protocol adrenal CT. 3. Incidental findings as above including colonic diverticulosis without evidence of diverticulitis.  Advised her to meet with endo regarding adrenal nodule  CT also showed no ovaries, although pt states her ovaries should have been left in    CT abd also showed a small 4mm lung nodule  Pt smoked in the past but quit when was 61 years old  Ordered CT chest for her to complete in 5/18    Pt c/o feeling like something moving inside her L ear  Pt thinks this could be from congestion or earwax  Discussed that it looks nl  Advised using flonase    Pt states that she keeps drooling on the R side of her mouth, which resulted from skin changes after a facial rash  Pt states the drooling causes a rash  Discussed using vaseline as a barrier    Pt c/o an intense itch in the center of her back x 10 years  She states that this started before she got the tattoo on her back  Discussed that there is nothing specific that is visible on her back  Pt wonders if it could be from nerve irritation, discussed that this is possible   Pt takes gabapentin, which helps - she takes 300mg  qhs  Pt does not want to increase her dose of this    Pt will be getting hand surg next week with Dr Jill Poling    Pt had a zostavax vaccine in 2017  Discussed shingrix being a dead vaccine   Discussed it being more effective than zostavax (92% effective)  Discussed it being a 2 part series  Ordered 03/15/17    ??  PREVENTIVE:  Colonoscopy: 01/2017, Dr. Elmon Kirschner, will get report for review, 5 year repeat per pt, [facility] colon cancer (father), poylps in both parents  EGD: 11/18 Dr Elmon Kirschner, mild gastritis per pt  Pap: 12/17, Dr. Darl Pikes Dausch  Mammogram: Summer 2018, [facility], will get report  Dexa: not yet needed  Tdap: 03/06/16   Pneumovax: not yet needed  Zostavax: 01/25/16  Shingrix: ordered  Flu shot: 10/18  Eye exam: summer 2018  Hep C screen: 12/17, negative  Lipids: 12/17 LDL 121  EKG: 03/06/16, nsr     There are no active problems to display for this patient.    Current Outpatient Medications   Medication Sig Dispense Refill   ???  atorvastatin (LIPITOR) 20 mg tablet TAKE 1 TABLET BY MOUTH ONE TIME DAILY 30 Tab 0   ??? gabapentin (NEURONTIN) 100 mg capsule TAKE  3 CAPSULES BY MOUTH EVERY EVENING  90 Cap 0   ??? naproxen sodium (ALEVE) 220 mg tablet Take 500 mg by mouth as needed.     ??? FEXOFENADINE HCL (WAL-FEX ALLERGY PO) Take  by mouth daily as needed.       Past Surgical History:   Procedure Laterality Date   ??? HX KNEE ARTHROSCOPY Right    ??? HX KNEE ARTHROSCOPY     ??? HX PARTIAL HYSTERECTOMY     ??? HX TONSILLECTOMY     ??? HX WISDOM TEETH EXTRACTION        Lab Results   Component Value Date/Time    WBC 7.0 03/06/2016 09:57 AM    HGB 13.8 03/06/2016 09:57 AM    HCT 40.5 03/06/2016 09:57 AM    PLATELET 274 03/06/2016 09:57 AM    MCV 90 03/06/2016 09:57 AM     Lab Results   Component Value Date/Time    Cholesterol, total 261 (H) 03/06/2016 09:57 AM    HDL Cholesterol 57 03/06/2016 09:57 AM    LDL, calculated 171 (H) 03/06/2016 09:57 AM     Triglyceride 165 (H) 03/06/2016 09:57 AM     Lab Results   Component Value Date/Time    GFR est non-AA 74 03/06/2016 09:57 AM    GFR est AA 86 03/06/2016 09:57 AM    Creatinine 0.85 03/06/2016 09:57 AM    BUN 23 03/06/2016 09:57 AM    Sodium 145 (H) 03/06/2016 09:57 AM    Potassium 4.4 03/06/2016 09:57 AM    Chloride 104 03/06/2016 09:57 AM    CO2 24 03/06/2016 09:57 AM      Review of Systems   Respiratory: Negative for shortness of breath.    Cardiovascular: Negative for chest pain.       Physical Exam   Constitutional: She is oriented to person, place, and time. She appears well-developed and well-nourished. No distress.   HENT:   Head: Normocephalic and atraumatic.   Right Ear: External ear normal.   Left Ear: External ear normal.   Mouth/Throat: Oropharynx is clear and moist. No oropharyngeal exudate.   Eyes: Conjunctivae and EOM are normal. Right eye exhibits no discharge.   Neck: Normal range of motion. Neck supple.   No carotid bruits    Cardiovascular: Normal rate, regular rhythm and normal heart sounds. Exam reveals no gallop and no friction rub.   No murmur heard.  Pulmonary/Chest: Effort normal and breath sounds normal. No respiratory distress. She has no wheezes. She has no rales. She exhibits no tenderness.   Abdominal: Soft. She exhibits no distension and no mass. There is no tenderness. There is no rebound and no guarding.   Musculoskeletal: Normal range of motion. She exhibits no edema, tenderness or deformity.   Lymphadenopathy:     She has no cervical adenopathy.   Neurological: She is alert and oriented to person, place, and time. Coordination normal.   Skin: Skin is warm and dry. No rash noted. She is not diaphoretic. No erythema. No pallor.   Psychiatric: She has a normal mood and affect. Her behavior is normal.       ASSESSMENT and PLAN    ICD-10-CM ICD-9-CM    1. Physical exam, annual    Discussed diet and w/l, wt minimally above goal, she will work on  portions, COLO UTD,  mammo and pelvic UTD, eye exam UTD per her report, flu UTD, shingrix ordered, tdap UTD   Z00.00 V70.0 LIPID PANEL      METABOLIC PANEL, COMPREHENSIVE      CBC W/O DIFF      TSH 3RD GENERATION      HEMOGLOBIN A1C WITH EAG   2. Adrenal nodule (HCC)    Will send to endo for w/u, discussed likely benign   E27.9 255.8 REFERRAL TO ENDOCRINOLOGY   3. Lung nodules    Will need CT chest for further eval, have ordered this to complete in 6 months   R91.8 793.19 CT CHEST W CONT   4. Pure hypercholesterolemia    Diet controlled, check lipids   E78.00 272.0         Scribed by Orinda KennerMadeleine Miles of [employer], as dictated by Dr. Roney MansJennifer Arleatha Philipps.    Current diagnosis and concerns discussed with pt at length. Pt understands risks and benefits or current treatment plan and medications, and accepts the treatment and medication with any possible risks. Pt asks appropriate questions, which were answered. Pt was instructed to call with any concerns or problems.     I have reviewed the note documented by the scribe.  The services provided are my own.  The documentation is accurate

## 2017-03-15 NOTE — Patient Instructions (Signed)
Get ct chest in 5-6 months to follow up on lung nodule    Labs today

## 2017-03-16 LAB — CBC W/O DIFF
HCT: 40.2 % (ref 34.0–46.6)
HGB: 13.3 g/dL (ref 11.1–15.9)
MCH: 30 pg (ref 26.6–33.0)
MCHC: 33.1 g/dL (ref 31.5–35.7)
MCV: 91 fL (ref 79–97)
PLATELET: 252 10*3/uL (ref 150–379)
RBC: 4.44 x10E6/uL (ref 3.77–5.28)
RDW: 13.6 % (ref 12.3–15.4)
WBC: 7.3 10*3/uL (ref 3.4–10.8)

## 2017-03-16 LAB — METABOLIC PANEL, COMPREHENSIVE
A-G Ratio: 2.1 (ref 1.2–2.2)
ALT (SGPT): 13 IU/L (ref 0–32)
AST (SGOT): 19 IU/L (ref 0–40)
Albumin: 4.5 g/dL (ref 3.6–4.8)
Alk. phosphatase: 78 IU/L (ref 39–117)
BUN/Creatinine ratio: 20 (ref 12–28)
BUN: 17 mg/dL (ref 8–27)
Bilirubin, total: 0.4 mg/dL (ref 0.0–1.2)
CO2: 20 mmol/L (ref 20–29)
Calcium: 9.5 mg/dL (ref 8.7–10.3)
Chloride: 104 mmol/L (ref 96–106)
Creatinine: 0.86 mg/dL (ref 0.57–1.00)
GFR est AA: 84 mL/min/{1.73_m2} (ref >59)
GFR est non-AA: 73 mL/min/{1.73_m2} (ref >59)
GLOBULIN, TOTAL: 2.1 g/dL (ref 1.5–4.5)
Glucose: 70 mg/dL (ref 65–99)
Potassium: 4.4 mmol/L (ref 3.5–5.2)
Protein, total: 6.6 g/dL (ref 6.0–8.5)
Sodium: 142 mmol/L (ref 134–144)

## 2017-03-16 LAB — LIPID PANEL
Cholesterol, total: 188 mg/dL (ref 100–199)
HDL Cholesterol: 63 mg/dL (ref >39)
LDL, calculated: 106 mg/dL — ABNORMAL HIGH (ref 0–99)
Triglyceride: 95 mg/dL (ref 0–149)
VLDL, calculated: 19 mg/dL (ref 5–40)

## 2017-03-16 LAB — HEMOGLOBIN A1C WITH EAG
Estimated average glucose: 108 mg/dL
Hemoglobin A1c: 5.4 % (ref 4.8–5.6)

## 2017-03-16 LAB — TSH 3RD GENERATION: TSH: 0.881 u[IU]/mL (ref 0.450–4.500)

## 2017-03-25 ENCOUNTER — Inpatient Hospital Stay: Admit: 2017-03-25 | Payer: BLUE CROSS/BLUE SHIELD | Attending: Internal Medicine

## 2017-03-25 ENCOUNTER — Ambulatory Visit

## 2017-03-25 DIAGNOSIS — R911 Solitary pulmonary nodule: Secondary | ICD-10-CM

## 2017-03-25 MED ORDER — SODIUM CHLORIDE 0.9 % IV
Freq: Once | INTRAVENOUS | Status: AC
Start: 2017-03-25 — End: 2017-03-25
  Administered 2017-03-25: 17:00:00 via INTRAVENOUS

## 2017-03-25 MED ORDER — SODIUM CHLORIDE 0.9 % IJ SYRG
Freq: Once | INTRAMUSCULAR | Status: AC
Start: 2017-03-25 — End: 2017-03-25
  Administered 2017-03-25: 17:00:00 via INTRAVENOUS

## 2017-03-25 MED ORDER — IOPAMIDOL 76 % IV SOLN
370 mg iodine /mL (76 %) | Freq: Once | INTRAVENOUS | Status: AC
Start: 2017-03-25 — End: 2017-03-25
  Administered 2017-03-25: 17:00:00 via INTRAVENOUS

## 2017-03-27 NOTE — Progress Notes (Signed)
Let her know there is a spot in her liver that appears to be benign, will need to repeat a CT abd in about 6 months to ensure stable--    Will repeat ct of chest as well regarding nodule in lung in 6 months

## 2017-03-27 NOTE — Telephone Encounter (Signed)
#  [phone] [facility]  Did you receive? Please call

## 2017-03-27 NOTE — Telephone Encounter (Signed)
[facility] [facility] to [facility] CT results received.

## 2017-03-28 NOTE — Progress Notes (Signed)
Disregard encounter.

## 2017-03-28 NOTE — Progress Notes (Signed)
Called, spoke to pt.  Two pt identifiers confirmed.   Pt informed per Dr. Katrinka BlazingSmith there is a spot in her liver that appears to be benign, will need to repeat a CT abd in about 6 months to ensure stable--Will repeat ct of chest as well regarding nodule in lung in 6 months.  VORB Dr. Katrinka BlazingSmith, Ct of abd and chest placed.  Pt verbalized understanding of information discussed w/ no further questions at this time.

## 2017-03-29 ENCOUNTER — Encounter

## 2017-03-29 NOTE — Progress Notes (Signed)
VORB Dr. Katrinka BlazingSmith, orders placed for CT scans per result note.

## 2017-04-04 ENCOUNTER — Encounter

## 2017-04-04 MED ORDER — ATORVASTATIN 20 MG TAB
20 mg | ORAL_TABLET | ORAL | 0 refills | Status: DC
Start: 2017-04-04 — End: 2017-05-13

## 2017-04-04 MED ORDER — GABAPENTIN 100 MG CAP
100 mg | ORAL_CAPSULE | ORAL | 0 refills | Status: DC
Start: 2017-04-04 — End: 2017-05-13

## 2017-05-13 ENCOUNTER — Encounter

## 2017-05-13 NOTE — Telephone Encounter (Signed)
Called, spoke to pt.  Two pt identifiers confirmed.   Pt requesting her medications be filled for a year supply instead of a month to month basis.   Informed pt that her prescriptions will be pend to Dr. Katrinka BlazingSmith and that hopefully sometime this week her medications will be sent through.   Pt verbalized understanding of information discussed w/ no further questions at this time.

## 2017-05-13 NOTE — Telephone Encounter (Signed)
Pt requesting a call back concerning auto refill for her "Atorvastatin 20mg " and her "Gabapentin 100mg ". Best contact numbers are [phone] (h) and [phone] (c).       Message received & copied from [facility]

## 2017-05-14 MED ORDER — ATORVASTATIN 20 MG TAB
20 mg | ORAL_TABLET | ORAL | 1 refills | Status: DC
Start: 2017-05-14 — End: 2017-11-04

## 2017-05-14 MED ORDER — GABAPENTIN 100 MG CAP
100 mg | ORAL_CAPSULE | ORAL | 2 refills | Status: DC
Start: 2017-05-14 — End: 2017-09-28

## 2017-06-25 ENCOUNTER — Ambulatory Visit: Admit: 2017-06-25 | Discharge: 2017-06-25 | Payer: PRIVATE HEALTH INSURANCE | Attending: "Endocrinology

## 2017-06-25 DIAGNOSIS — E278 Other specified disorders of adrenal gland: Secondary | ICD-10-CM

## 2017-06-25 MED ORDER — DEXAMETHASONE 1 MG TAB
1 mg | ORAL_TABLET | Freq: Once | ORAL | 0 refills | Status: AC
Start: 2017-06-25 — End: 2017-06-25

## 2017-06-25 NOTE — Patient Instructions (Signed)
Plan to evaluate for the possibility of cushings disease.     1. Plan to pick up a tablet of dexamethasone 1mg , from your pharmacy.     2. The night before you go to the laboratory, take the 1mg  tablet of dexamethasone at 11 PM at night.     3. The following morning after taking the tablet, you need to be present at the lab to have your cortisol level drawn at 8AM. This must be collected around 8AM and only if you took the dexamethasone tablet the night before at 11 PM.     4. Also, that following morning when they collect the cortsol level, they can also collect the other labs needed if there are any. In your case they will also collect labs as ordered on the sheet.     I will touch base with you regarding the results of your labs,    Plan to return to clinic in 6 months,    Suzanne StallionGeorge T. Salley ScarletGeorges MD  [facility]  [facility]

## 2017-06-25 NOTE — Progress Notes (Signed)
Renin / Aldo, serum metanephrines are normal,   Dex suppression abnormal with AM cortisol of 5.2,  I called patient to discuss,   She notes she discontinued some prednisone 5 days prior to the test, (the Sunday before her visit),  Though we decided it would be good to perform a confirmatory test,  Plan to do a salivary cortisol test x2, per her preference over the 24 hour urine collection,   Will leave kit and instructions for her to pick up from [place] at her convenience,    Iona Beard [facility]  [place]

## 2017-06-25 NOTE — Progress Notes (Signed)
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 female with a PMHx as noted below who was referred to our endocrinology clinic for evaluation of an adrenal nodule.     Patient was noted for evaluation of lower left quadrant abdominal discomfort with CT abdomen. Imaging identified an adrenal nodule.     Review of Imaging: 01/23/17  10 x 12 mm left adrenal body nodule    Symptom Review:  Diabetes ?: No prior history  HTN: No prior hx of HTN  Na / K: normal  Easy bruising ?: Denies any  Episodic HTN/Palpitations/Headache ?: Denies any    No adrenal specific labs collected as of yet.    PAST MEDICAL/SURGICAL HISTORY:   Past Medical History:   Diagnosis Date   ??? Arthritis    ??? Pancreatitis     when pt was 62 y.o   ??? Pure hypercholesterolemia 03/15/2017     Past Surgical History:   Procedure Laterality Date   ??? HX KNEE ARTHROSCOPY Right    ??? HX KNEE ARTHROSCOPY     ??? HX PARTIAL HYSTERECTOMY     ??? HX TONSILLECTOMY     ??? HX WISDOM TEETH EXTRACTION         ALLERGIES:   Allergies   Allergen Reactions   ??? Doxycycline Vertigo   ??? Skelaxin [Metaxalone] Other (comments)   ??? Macrobid [Nitrofurantoin Monohyd/M-Cryst] Nausea and Vomiting       MEDICATIONS ON ADMISSION:     Current Outpatient Medications:   ???  raNITIdine (ZANTAC) 150 mg tablet, Take 150 mg by mouth two (2) times a day., Disp: , Rfl:   ???  calcium carbonate (TUMS) 200 mg calcium (500 mg) chew, Take 1 Tab by mouth daily., Disp: , Rfl:   ???  Omeprazole delayed release (PRILOSEC D/R) 20 mg tablet, Take 20 mg by mouth daily., Disp: , Rfl:   ???  gabapentin (NEURONTIN) 100 mg capsule, TAKE 3 CAPSULES BY MOUTH EVERY EVENING , Disp: 90 Cap, Rfl: 2  ???  atorvastatin (LIPITOR) 20 mg tablet, TAKE 1 TABLET BY MOUTH ONE TIME DAILY, Disp: 90 Tab, Rfl: 1  ???  naproxen sodium (ALEVE) 220 mg tablet, Take 500 mg by mouth as needed.,  Disp: , Rfl:   ???  FEXOFENADINE HCL (WAL-FEX ALLERGY PO), Take  by mouth daily as needed., Disp: , Rfl:     SOCIAL HISTORY:   Social History     Socioeconomic History   ??? Marital status: MARRIED     Spouse name: Not on file   ??? Number of children: Not on file   ??? Years of education: Not on file   ??? Highest education level: Not on file   Occupational History   ??? Not on file   Social Needs   ??? Financial resource strain: Not on file   ??? Food insecurity:     Worry: Not on file     Inability: Not on file   ??? Transportation needs:     Medical: Not on file     Non-medical: Not on file   Tobacco Use   ??? Smoking status: Former Smoker   ??? Smokeless tobacco: Never Used   Substance and Sexual Activity   ??? Alcohol use: No   ??? Drug use: No   ??? Sexual activity: Yes  Lifestyle   ??? Physical activity:     Days per week: Not on file     Minutes per session: Not on file   ??? Stress: Not on file   Relationships   ??? Social connections:     Talks on phone: Not on file     Gets together: Not on file     Attends religious service: Not on file     Active member of club or organization: Not on file     Attends meetings of clubs or organizations: Not on file     Relationship status: Not on file   ??? Intimate partner violence:     Fear of current or ex partner: Not on file     Emotionally abused: Not on file     Physically abused: Not on file     Forced sexual activity: Not on file   Other Topics Concern   ??? Not on file   Social History Narrative   ??? Not on file       FAMILY HISTORY:  Family History   Problem Relation Age of Onset   ??? Hypertension Mother    ??? Cancer Father    ??? Heart Disease Father    ??? Hypertension Father    ??? Elevated Lipids Father        REVIEW OF SYSTEMS: Complete ROS assessed and noted for that which is described above, all else are negative.  Eyes: normal  ENT: normal  CVS: normal  Resp: normal  GI: normal  GU: normal  GYN: normal  Endocrine: normal  Integument: normal  Musculoskeletal: normal  Neuro: normal  Psych: normal       PHYSICAL EXAMINATION:    VITAL SIGNS:  Visit Vitals  BP 121/79 (BP 1 Location: Left arm, BP Patient Position: Sitting)   Pulse 79   Ht 5\' 9"  (1.753 m)   Wt 165 lb (74.8 kg)   BMI 24.37 kg/m??       GENERAL: NCAT, Sitting comfortably, NAD  EYES: EOMI, non-icteric, no proptosis  Ear/Nose/Throat: NCAT, no inflammation, no masses  LYMPH NODES: No LAD  CARDIOVASCULAR: S1 S2, RRR, No murmur, 2+ radial pulses  RESPIRATORY: CTA b/l, no wheeze/rales  GASTROINTESTINAL: soft, NT, ND  MUSCULOSKELETAL: Normal ROM, no atrophy  SKIN: warm, no edema/rash/ or other skin changes  NEUROLOGIC: 5/5 power all extremities, no tremors, AAOx3  PSYCHIATRIC: Normal affect, Normal insight and judgement    REVIEW OF LABORATORY AND RADIOLOGY DATA:   Labs and documentation have been reviewed as described above.     ASSESSMENT AND PLAN:   Suzanne Norman is a 62 y.o. female with a PMHx as noted above who was referred to our endocrinology clinic for evaluation of an adrenal nodule.    Adrenal Nodule    Today we spent time discussing the nature of adrenal nodules in the context of anatomical and physiological dysfunction and the need to evaluate both of these. Evaluation of the anatomy to assess for any growth or malignant potential, and evaluation of adrenal physiology to assess for any hormone oversecretion by the adrenal gland. We discussed the disease states that can present from such hormone oversecretion and the benefits of early detection and treatment. Patient demonstrated their understanding. Currently she is asymptomatic.    Plan:  Check Serum metanephrines, Serum Renin/Aldosterone, Dexamethasone Suppression Test.   Will discuss results / plan by phone, further testing to be requested as appropriate,  CT abdomen (Adrenal Protocol preferable), 6 month repeat scan  already requested per patient,  Will have her RTC in 6 months for re-evaluation,    Greggory Stallion T. Salley Scarlet MD  [facility]

## 2017-07-01 LAB — ALDOSTERONE/RENIN ACTIVITY
Aldosterone: 8.2 ng/dL (ref 0.0–30.0)
Renin Activity: 2.063 ng/mL/hr (ref 0.167–5.380)

## 2017-07-01 LAB — METANEPHRINES, PLASMA
Metanephrine, free: 18 pg/mL (ref 0–62)
Normetanephrine, free: 86 pg/mL (ref 0–145)

## 2017-07-01 LAB — CORTISOL, AM: Cortisol, a.m.: 5.2 ug/dL — ABNORMAL LOW (ref 6.2–19.4)

## 2017-07-02 ENCOUNTER — Encounter

## 2017-07-05 NOTE — Progress Notes (Signed)
Salivary cortisol tests are negative x2,  This is reassuring and completes the biochemical evaluation for this visit with good findings,  Likely her less ideal dex suppression result was related to prior prednisone use,  I called and notified patient,  Plan for f/u as we had discussed,    Greggory StallionGeorge T. Salley ScarletGeorges MD  [facility]  [facility]

## 2017-07-16 LAB — SALIVARY CORTISOL X2, TIMED
Salivary cortisol #1: 0.023 ug/dL
Salivary cortisol #2: 0.02 ug/dL

## 2017-07-16 NOTE — Telephone Encounter (Signed)
07/16/2017  9:34 AM        Please see message below from answering service regarding lab results.          Thanks

## 2017-07-16 NOTE — Telephone Encounter (Signed)
-----   Message from Lorre NickNicole A Brooks sent at 07/16/2017  9:30 AM EDT -----  Regarding: Dr.Georges/Telephone  Pt called requesting a call back for test results . Pt best contact number is [phone] or [phone] .

## 2017-07-16 NOTE — Telephone Encounter (Signed)
I returned this call and let Ms. Skora know that the Salivary test just resulted this morning. I showed it to Dr. Irving BurtonSealand and he said there was nothing to worry about. I relayed this to her and told her that Dr. Salley ScarletGeorges would give her a call with the results and go over what the next step may be when he returns to the office next week.  Kennon RoundsSally

## 2017-09-28 ENCOUNTER — Encounter

## 2017-09-30 NOTE — Telephone Encounter (Signed)
Needs to schedule her CT chest--was ordered and needed to be completed in June    Also needs to schedule annual for december

## 2017-10-07 NOTE — Telephone Encounter (Signed)
#  [phone] pt would like to know why her script has not been filled?  Pt has two days left and that is taking a lower dosage than normal.  Please call pt as this has been since 09-28-17

## 2017-10-09 MED ORDER — GABAPENTIN 100 MG CAP
100 mg | ORAL_CAPSULE | ORAL | 0 refills | Status: DC
Start: 2017-10-09 — End: 2017-10-11

## 2017-10-09 NOTE — Telephone Encounter (Signed)
Called, left vm for pt to return call to office.    Pt needs to schedule f/u appointment and CT scan to get medications refilled.

## 2017-10-09 NOTE — Telephone Encounter (Addendum)
Pt would like an answer about this medication.  She has called, the pharm has called as well. What is the hold up pt is asking?   Please call          [phone]

## 2017-10-09 NOTE — Telephone Encounter (Addendum)
-----   Message from [facility] Drew[facility]ack sent at 10/08/2017  6:45 PM EDT -----  Regarding: Dr. Nigel SloopSmith/Refill  ##urgent##  Pt [phone] has been trying to get a refill on gabapentin for 2 weeks. Pt is completely out of the meds. Pls call into [facility] on file.     Copy/paste [place]

## 2017-10-09 NOTE — Telephone Encounter (Signed)
Called, spoke to pt.  Two identifiers confirmed.  Pt stated she currently does not have health insurance therefore she has not been able to get CT scan done.   Appointment scheduled for 12/2 @ 830.   Pt verbalized understanding of information discussed w/ no further questions at this time.

## 2017-10-09 NOTE — Telephone Encounter (Signed)
Patient states she's returning your call. Please call. Thank you

## 2017-10-10 NOTE — Telephone Encounter (Signed)
RX called into pt's pharmacy.

## 2017-10-10 NOTE — Progress Notes (Signed)
The following prescription was called into pt's local pharmacy:    gabapentin (NEURONTIN) 100 mg capsule [[id number]]   ?? Order Details   Dose, Route, Frequency: As Directed    Dispense Quantity: 90 Cap Refills: 0 Fills remaining: --    ??      Sig: TAKE 3 CAPSULES BY MOUTH EVERY EVENING   ??      Written Date: 10/09/17 Expiration Date: --     Start Date: 10/09/17 End Date: --

## 2017-10-10 NOTE — Progress Notes (Signed)
The following prescription was called into pt's local pharmacy:    gabapentin (NEURONTIN) 100 mg capsule [[id number]]    Order Details   Dose, Route, Frequency: As Directed    Dispense Quantity: 90 Cap Refills: 0 Fills remaining: --          Sig: TAKE 3 CAPSULES BY MOUTH EVERY EVENING         Written Date: 10/09/17 Expiration Date: --     Start Date: 10/09/17 End Date: --

## 2017-10-11 ENCOUNTER — Encounter

## 2017-10-11 NOTE — Telephone Encounter (Signed)
General Message/Vendor Calls     Caller's first and last name:   pt     Reason for call:   Medication not being refilled     Callback required yes/no and why:   yes     Best contact number(s):   [phone]     Details to clarify the request:   Requesting to speak with provider in regards to why medication "Gabapentin" 300 MG is not being refilled     BJ's WholesaleMickenzie R Norman       Message received & copied from [facility]

## 2017-10-11 NOTE — Telephone Encounter (Signed)
Bronwen BettersSmith, Jennifer G, MD  You 47 minutes ago (3:20 PM)     That's fine pend script    Routing comment

## 2017-10-11 NOTE — Telephone Encounter (Signed)
Called, spoke to pt.  Two identifiers confirmed.  Notified pt RX was called into pharmacy on 7/18.  Pt verbalized understanding of information discussed w/ no further questions at this time.

## 2017-10-12 MED ORDER — GABAPENTIN 100 MG CAP
100 mg | ORAL_CAPSULE | ORAL | 0 refills | Status: DC
Start: 2017-10-12 — End: 2017-11-04

## 2017-11-04 ENCOUNTER — Encounter

## 2017-11-04 MED ORDER — GABAPENTIN 100 MG CAP
100 mg | ORAL_CAPSULE | ORAL | 0 refills | Status: DC
Start: 2017-11-04 — End: 2017-12-17

## 2017-11-04 MED ORDER — ATORVASTATIN 20 MG TAB
20 mg | ORAL_TABLET | ORAL | 0 refills | Status: DC
Start: 2017-11-04 — End: 2018-02-24

## 2017-11-04 NOTE — Telephone Encounter (Signed)
Please refill.    Pt would like a call to let her know when this goes to the pharmacy.  You may leave a detailed vm

## 2017-11-04 NOTE — Telephone Encounter (Signed)
PCP: Bronwen BettersSmith, Jennifer G, MD    Last appt: 03/15/2017  Future Appointments   Date Time Provider Department Center   02/24/2018  8:30 AM Bronwen BettersSmith, Jennifer G, MD [facility]       Requested Prescriptions     Pending Prescriptions Disp Refills   ??? gabapentin (NEURONTIN) 100 mg capsule [Pharmacy Med Name: Gabapentin Oral Capsule 100 MG] 90 Cap 0     Sig: TAKE 3 CAPSULES BY MOUTH EVERY EVENING   ??? atorvastatin (LIPITOR) 20 mg tablet [Pharmacy Med Name: Atorvastatin Calcium Oral Tablet 20 MG] 90 Tab 0     Sig: TAKE 1 TABLET BY MOUTH ONE TIME DAILY

## 2017-11-05 NOTE — Progress Notes (Signed)
Following rx was faxed to pharmacy with confirmation received:      gabapentin (NEURONTIN) 100 mg capsule [[id number]]   ?? Order Details   Dose, Route, Frequency: As Directed    Dispense Quantity: 90 Cap Refills: 0 Fills remaining: --    ??      Sig: TAKE 3 CAPSULES BY MOUTH EVERY EVENING   ??      Written Date: 11/04/17 Expiration Date: --     Start Date: 11/04/17 End Date: --

## 2017-11-05 NOTE — Progress Notes (Signed)
Following rx was faxed to pharmacy with confirmation received:      gabapentin (NEURONTIN) 100 mg capsule [[id number]]    Order Details   Dose, Route, Frequency: As Directed    Dispense Quantity: 90 Cap Refills: 0 Fills remaining: --          Sig: TAKE 3 CAPSULES BY MOUTH EVERY EVENING         Written Date: 11/04/17 Expiration Date: --     Start Date: 11/04/17 End Date: --

## 2017-11-22 NOTE — Telephone Encounter (Signed)
Called, spoke to pt.  Two pt identifiers confirmed.   Pt states [facility] is not [facility].  Pt states she wants it done at [facility].  Patient asking to be faxed there at [phone].  Orders faxed to [facility] w/ confirmation received.   Pt verbalized understanding of information discussed w/ no further questions at this time.

## 2017-11-22 NOTE — Telephone Encounter (Signed)
Suzanne Norman, Suzanne C  P [facility]      ??      General Message/Vendor Calls     Pt is requesting to speak with nurse in regard to discussing which body part she needs to obtain a CT Scan     Callback required     Best contact number(s): [phone]     [name]     Message received & copied from [facility]

## 2017-11-28 NOTE — Telephone Encounter (Addendum)
Called, spoke to [place] at [employer].   Ann informed per PSR SM that pt's insurance is denying CT chest and needs peer to peer.   Ann informed that Dr. Katrinka Blazing is not in clinic today to do the peer-to-peer.  Ann informed that no word was given on the CT Abd for they would review both CT's together.   Dewayne Hatch states that she will call the pt to reschedule.

## 2017-11-28 NOTE — Telephone Encounter (Signed)
Spoke to [place] at [employer].   Dewayne Hatch states that insurance Berkley Harvey is needed if there has been a denial.   Ann informed to the office's knowledge, there has been no denial.  Dewayne Hatch also informed per PSR SM that pt's insurance should not need authorization.   Dewayne Hatch states that if the office will contact pt's insurance and if told there is no auth needed, to provide a reference number to [facility].

## 2017-11-28 NOTE — Telephone Encounter (Signed)
Ann from Imaging center lvm stating that pt needs prior auth for CT scan tomorrow.

## 2017-11-29 NOTE — Telephone Encounter (Signed)
Bronwen Betters, MD  Erle Crocker, LPN   Caller: Unspecified (Yesterday, 12:03 PM)      ??      Continue with ct abd     Cancel ct chest     Will do f/u on the lung nodule at 1 year from last ct chest

## 2017-12-03 NOTE — Telephone Encounter (Signed)
Called, spoke to pt.  Two pt identifiers confirmed.   Pt informed per insurance that the CT Chest is only to be covered once per year.  Pt informed to the knowledge of the office, the CT Abd has not been denied.   Pt informed per Dr. Katrinka Blazing to progress with the CT Abd and we can do the CT Chest at the year mark (03/25/17).  Pt states that since we are close to the year mark, she would prefer to have them completed at the same time given both with contrast.   Pt will go to [facility] still.  Pt advised to contact [facility] and the office come time for repeat of the CT scans.   Pt verbalized understanding of information discussed w/ no further questions at this time.

## 2017-12-17 ENCOUNTER — Encounter

## 2017-12-17 MED ORDER — GABAPENTIN 100 MG CAP
100 mg | ORAL_CAPSULE | ORAL | 0 refills | Status: DC
Start: 2017-12-17 — End: 2018-02-24

## 2018-01-20 NOTE — Telephone Encounter (Signed)
Spoke to [place] at [employer].  Elita Quick states that the pt is starting the process of getting the CT ABD/Chest started for her f/u imaging.   CPT and ICD:10 codes provided.  Elita Quick states nothing further is needed at this time.  Elita Quick states that the insurance may request records at a later date.

## 2018-01-20 NOTE — Telephone Encounter (Signed)
Page, Linde Gillis D  P [facility]      ??      General Message/Vendor Calls     Caller's first and last name:   Ottis Stain [facility]     Reason for call:   Procedure code     Callback required yes/no and why:   Yes, requesting a return call, regarding CT scan procedure code     Best contact number(s):     [phone]   Details to clarify the request:       Daivd Council Page    Message received & copied from [facility]

## 2018-01-21 NOTE — Telephone Encounter (Signed)
Ann form [facility] states that pt needs auth for ct of abdomen and chest.   Phone [phone] Fax [phone]

## 2018-01-22 NOTE — Telephone Encounter (Signed)
Jovita Kussmaul D  P [facility]      ??      [facility] is calling to get an authorization for a CT of the abdomin and a Ct of the chest. Pt's appointment is tomorrow 01/23/18. Please fax over orders to fax number [phone]. Best contact phone number is [phone].     Message received & copied from [facility]

## 2018-02-24 ENCOUNTER — Ambulatory Visit: Admit: 2018-02-24 | Payer: PRIVATE HEALTH INSURANCE | Attending: Internal Medicine

## 2018-02-24 ENCOUNTER — Ambulatory Visit: Attending: Internal Medicine

## 2018-02-24 DIAGNOSIS — R350 Frequency of micturition: Secondary | ICD-10-CM

## 2018-02-24 LAB — AMB POC URINALYSIS DIP STICK AUTO W/O MICRO
Bilirubin (UA POC): NEGATIVE
Bilirubin, Urine, POC: NEGATIVE
Blood (UA POC): NEGATIVE
Blood (UA POC): NEGATIVE
Glucose (UA POC): NEGATIVE
Glucose, Urine, POC: NEGATIVE
Ketones (UA POC): NEGATIVE
Ketones, Urine, POC: NEGATIVE
Nitrite, Urine, POC: NEGATIVE
Nitrites (UA POC): NEGATIVE
Protein (UA POC): NEGATIVE
Protein, Urine, POC: NEGATIVE
Specific Gravity, Urine, POC: 1.005 NA (ref 1.001–1.035)
Specific gravity (UA POC): 1.005 (ref 1.001–1.035)
Urobilinogen (UA POC): 0.2 (ref 0.2–1)
Urobilinogen, POC: 0.2 (ref 0.2–1)
pH (UA POC): 6 (ref 4.6–8.0)
pH, Urine, POC: 6 NA (ref 4.6–8.0)

## 2018-02-24 MED ORDER — GABAPENTIN 300 MG CAP
300 mg | ORAL_CAPSULE | Freq: Every evening | ORAL | 2 refills | Status: DC
Start: 2018-02-24 — End: 2018-02-26

## 2018-02-24 MED ORDER — CLOBETASOL 0.05 % OINTMENT
0.05 % | Freq: Two times a day (BID) | CUTANEOUS | 0 refills | Status: AC
Start: 2018-02-24 — End: ?

## 2018-02-24 MED ORDER — MUPIROCIN 2 % NASAL OINTMENT
2 % | Freq: Two times a day (BID) | NASAL | 0 refills | Status: AC
Start: 2018-02-24 — End: ?

## 2018-02-24 MED ORDER — ATORVASTATIN 20 MG TAB
20 mg | ORAL_TABLET | ORAL | 0 refills | Status: DC
Start: 2018-02-24 — End: 2018-02-26

## 2018-02-24 MED ORDER — CLOBETASOL 0.05 % OINTMENT
0.05 % | Freq: Two times a day (BID) | CUTANEOUS | 0 refills | Status: DC
Start: 2018-02-24 — End: 2018-02-24

## 2018-02-24 MED ORDER — MUPIROCIN 2 % NASAL OINTMENT
2 % | Freq: Two times a day (BID) | NASAL | 0 refills | Status: DC
Start: 2018-02-24 — End: 2018-02-24

## 2018-02-24 NOTE — Progress Notes (Signed)
SL

## 2018-02-24 NOTE — Telephone Encounter (Addendum)
-----   Message from Berline LopesAntoine L Peyton sent at 02/24/2018  4:17 PM EST -----  Regarding: Dr. Katrinka BlazingSmith / Refill  Best contact number(s): [phone]  Name of medication and dosage if known: Atorvastatin - 20 mg  Is patient out of this medication (yes/no): Yes  [facility]  Pharmacy listed in chart? (yes/no): Yes  Pharmacy phone number:  [phone]  Pharmacy Fax Number :  Date of last visit:  February 24, 2018 08:30 AM  Details to clarify: Pt. Requesting 90 Day Supply because its the cheaper option for the pt. Pt. Also requesting a 90 day supply for Gabapentin 300 mg.      Copy/paste envera

## 2018-02-24 NOTE — Progress Notes (Signed)
Sent!

## 2018-02-24 NOTE — Progress Notes (Signed)
HISTORY OF PRESENT ILLNESS  Suzanne Norman is a 62 y.o. female.  HPI   Last here 03/15/17 Pt is here for routine care.  ??  BP is 135/88  ??  Wt today is 162 lbs-- down 7 lbs x lov   Pt's wt is now in nl ranges   ??  Reviewed labs 12/18   ??  Pt follows with??Dr. Erlene Senters (derm) for an annual skin check    Since lov, started lipitor  Pt has been cutting this in half and taking 10mg  daily  ??  Lov, Pt c/o SOB when exercising--resolved  Reviewed   Reviewed stress- echo 04/18/16: Duration of exercise was 8 min. The patient exercised to  protocol stage 3. Maximal work rate was 10.2 METs. Maximal heart rate  during stress was 147 bpm ( 92 % of maximal predicted heart rate). There  was no chest pain during stress. The stress test was terminated due to  fatigue. Maximal systolic blood pressure during stress was 180 mmHg.  Maximal diastolic blood pressure during stress was 94 mmHg. The stress ECG  was negative for ischemia. There were no stress arrhythmias or conduction  abnormalities.  ??    Lov, Advised her to meet with endo regarding adrenal nodule  Pt now follows with Dr Salley Scarlet (endo)   Reviewed notes 06/25/17: Check Serum metanephrines, Serum Renin/Aldosterone, Dexamethasone Suppression Test.   Will discuss results / plan by phone, further testing to be requested as appropriate,  CT abdomen (Adrenal Protocol preferable), 6 month repeat scan already requested per patient,  Will have her RTC in 6 months for re-evaluation,  ??    CT abd also showed a small 4mm lung nodule  Pt smoked in the past but quit when was 62 years old  Ordered CT chest/abd  for her to complete--Scheduled for end of 12/19       Pt would like a rx for bactroban for drooling on the R side of her face that results in skin rash, states this sometimes turns into staph infection     ??  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 She does have improved MP stability with resulting pinch grip. She does lack some IP motion. She does lack some thumb abduction. She can do activities as tolerated. I will see her back as needed    Pt thinks she has a UTI   C/o burning and frequency with urination   Will check UA --negative    Pt states she saw a holistic dr   She is now on 2 supplements     Of note, pt is moving to [place] at the end of December       PREVENTIVE:  Colonoscopy:??01/2017, Dr. Elmon Kirschner, will get report for review, 5 year repeat per pt, [facility] colon cancer (father), poylps in both parents  EGD: 11/18 Dr Elmon Kirschner, mild gastritis per pt  Pap:??Summer 2019, Dr. Iona Coach, will get report   Mammogram:??Summer 2019,  [facility], will get report  Dexa:??not yet needed  Tdap:??03/06/16??  Pneumovax:??not yet needed  Zostavax:??01/25/16  Shingrix: 1st round completed,  Flu shot:??10/19   Eye exam: Winter 2019   Hep C screen: 12/17, negative  Lipids:??12/18 LDL 106  EKG:??03/06/16, nsr??    Patient Active Problem List    Diagnosis Date Noted   ??? Pure hypercholesterolemia 03/15/2017     Current Outpatient Medications   Medication Sig Dispense Refill   ??? gabapentin (NEURONTIN) 100 mg capsule TAKE 3 CAPSULES BY MOUTH EVERY EVENING  90 Cap 0   ??? atorvastatin (LIPITOR) 20 mg tablet TAKE 1 TABLET BY MOUTH ONE TIME DAILY 90 Tab 0   ??? raNITIdine (ZANTAC) 150 mg tablet Take 150 mg by mouth two (2) times a day.     ??? calcium carbonate (TUMS) 200 mg calcium (500 mg) chew Take 1 Tab by mouth daily.     ??? Omeprazole delayed release (PRILOSEC D/R) 20 mg tablet Take 20 mg by mouth daily.     ??? naproxen sodium (ALEVE) 220 mg tablet Take 500 mg by mouth as needed.     ??? FEXOFENADINE HCL (WAL-FEX ALLERGY PO) Take  by mouth daily as needed.       Past Surgical History:    Procedure Laterality Date   ??? HX KNEE ARTHROSCOPY Right    ??? HX KNEE ARTHROSCOPY     ??? HX PARTIAL HYSTERECTOMY     ??? HX TONSILLECTOMY     ??? HX WISDOM TEETH EXTRACTION        Lab Results   Component Value Date/Time    WBC 7.3 03/15/2017 11:36 AM    HGB 13.3 03/15/2017 11:36 AM    HCT 40.2 03/15/2017 11:36 AM    PLATELET 252 03/15/2017 11:36 AM    MCV 91 03/15/2017 11:36 AM     Lab Results   Component Value Date/Time    Cholesterol, total 188 03/15/2017 11:36 AM    HDL Cholesterol 63 03/15/2017 11:36 AM    LDL, calculated 106 (H) 03/15/2017 11:36 AM    Triglyceride 95 03/15/2017 11:36 AM     Lab Results   Component Value Date/Time    GFR est non-AA 73 03/15/2017 11:36 AM    GFR est AA 84 03/15/2017 11:36 AM    Creatinine 0.86 03/15/2017 11:36 AM    BUN 17 03/15/2017 11:36 AM    Sodium 142 03/15/2017 11:36 AM    Potassium 4.4 03/15/2017 11:36 AM    Chloride 104 03/15/2017 11:36 AM    CO2 20 03/15/2017 11:36 AM        Review of Systems   Respiratory: Negative for shortness of breath.    Cardiovascular: Negative for chest pain.       Physical Exam  Constitutional:       General: She is not in acute distress.     Appearance: She is well-developed. She is not diaphoretic.   HENT:      Head: Normocephalic and atraumatic.      Right Ear: Tympanic membrane, ear canal and external ear normal.      Left Ear: Tympanic membrane, ear canal and external ear normal.      Mouth/Throat:      Mouth: Mucous membranes are moist.      Pharynx: Oropharynx is clear. No oropharyngeal exudate or posterior oropharyngeal erythema.   Eyes:      General: No scleral icterus.        Right eye: No discharge.         Left eye: No discharge.      Conjunctiva/sclera: Conjunctivae normal.  Pupils: Pupils are equal, round, and reactive to light.   Neck:      Musculoskeletal: Normal range of motion and neck supple.      Vascular: No carotid bruit.   Cardiovascular:      Rate and Rhythm: Normal rate and regular rhythm.       Heart sounds: Normal heart sounds. No murmur. No friction rub. No gallop.    Pulmonary:      Effort: Pulmonary effort is normal. No respiratory distress.      Breath sounds: Normal breath sounds. No wheezing or rales.   Chest:      Chest wall: No tenderness.   Abdominal:      General: There is no distension.      Palpations: Abdomen is soft. There is no mass.      Tenderness: There is no tenderness. There is no guarding or rebound.      Hernia: No hernia is present.   Musculoskeletal: Normal range of motion.         General: No tenderness or deformity.      Right lower leg: No edema.      Left lower leg: No edema.   Lymphadenopathy:      Cervical: No cervical adenopathy.   Skin:     General: Skin is warm and dry.      Coloration: Skin is not pale.      Findings: No erythema or rash.   Neurological:      Mental Status: She is alert and oriented to person, place, and time.      Coordination: Coordination normal.   Psychiatric:         Behavior: Behavior normal.         ASSESSMENT and PLAN    ICD-10-CM ICD-9-CM    1. Physical exam, annual            Nl wt nl bp labs order flu shot shingrix utd mammo and pap utd will get report, colo utd  Z00.00 V70.0    2. Itch                  Improved with gabapentin 300 mg qhs  L29.9 698.9 gabapentin (NEURONTIN) 300 mg capsule      LIPID PANEL      METABOLIC PANEL, COMPREHENSIVE      CBC W/O DIFF      TSH 3RD GENERATION   3. Lung nodule                  Ct chest is scheduled for end of this month  R91.1 793.11 CT CHEST W CONT      LIPID PANEL      METABOLIC PANEL, COMPREHENSIVE      CBC W/O DIFF      TSH 3RD GENERATION   4. Adrenal nodule (HCC)            Ct scheduled for end of this month follows with dr Salley Scarletgeorges lab eval was unremarkable  E27.9 255.8 CT ABD PELV W CONT      LIPID PANEL      METABOLIC PANEL, COMPREHENSIVE      CBC W/O DIFF      TSH 3RD GENERATION   5. Mixed hyperlipidemia                    Controlled on lipitor, alternates between 10 mg and 20 mg  E78.2 272.2  LIPID PANEL      METABOLIC PANEL,  COMPREHENSIVE      CBC W/O DIFF      TSH 3RD GENERATION   6. Urinary frequency                        UA unremarkable will send for culture will only tx if culture positive  R35.0 788.41         Depression screen reviewed and negative.     Scribed by Laurin Coder of [employer], as dictated by Dr. Roney Mans.     Current diagnosis and concerns discussed with pt at length. Pt understands risks and benefits or current treatment plan and medications, and accepts the treatment and medication with any possible risks. Pt asks appropriate questions, which were answered. Pt was instructed to call with any concerns or problems.      I have reviewed the note documented by the scribe.  The services provided are my own.  The documentation is accurate

## 2018-02-24 NOTE — Progress Notes (Signed)
HISTORY OF PRESENT ILLNESS  Suzanne Norman is a 62 y.o. female.  HPI   Last here 03/15/17 Pt is here for routine care.  ??  BP is 135/88  ??  Wt today is 162 lbs-- down 7 lbs x lov   Pt's wt is now in nl ranges   ??  Reviewed labs 12/18   ??  Pt follows with??Dr. Erlene Senters (derm) for an annual skin check    Since lov, started lipitor  Pt has been cutting this in half and taking 10mg  daily  ??  Lov, Pt c/o SOB when exercising--resolved  Reviewed   Reviewed stress- echo 04/18/16: Duration of exercise was 8 min. The patient exercised to  protocol stage 3. Maximal work rate was 10.2 METs. Maximal heart rate  during stress was 147 bpm ( 92 % of maximal predicted heart rate). There  was no chest pain during stress. The stress test was terminated due to  fatigue. Maximal systolic blood pressure during stress was 180 mmHg.  Maximal diastolic blood pressure during stress was 94 mmHg. The stress ECG  was negative for ischemia. There were no stress arrhythmias or conduction  abnormalities.  ??    Lov, Advised her to meet with endo regarding adrenal nodule  Pt now follows with Dr Salley Scarlet (endo)   Reviewed notes 06/25/17: Check Serum metanephrines, Serum Renin/Aldosterone, Dexamethasone Suppression Test.   Will discuss results / plan by phone, further testing to be requested as appropriate,  CT abdomen (Adrenal Protocol preferable), 6 month repeat scan already requested per patient,  Will have her RTC in 6 months for re-evaluation,  ??    CT abd also showed a small 4mm lung nodule  Pt smoked in the past but quit when was 62 years old  Ordered CT chest/abd  for her to complete--Scheduled for end of 12/19       Pt would like a rx for bactroban for drooling on the R side of her face that results in skin rash, states this sometimes turns into staph infection     ??  Prev, Pt c/o an intense itch in the center of her back x 10 years  Pt takes gabapentin, which helps - she takes 300mg  qhs  Pt does not want to increase her dose of this    Pt saw Dr  Jill Poling for OA on her L hand   Had surgery on this hand 03/22/17  Reviewed notes 12/20/17: Unfortunately I do not think her thumb will improved significantly at this point. She does have improved MP stability with resulting pinch grip. She does lack some IP motion. She does lack some thumb abduction. She can do activities as tolerated. I will see her back as needed    Pt thinks she has a UTI   C/o burning and frequency with urination   Will check UA --negative    Pt states she saw a holistic dr   She is now on 2 supplements     Of note, pt is moving to [place] at the end of December       PREVENTIVE:  Colonoscopy:??01/2017, Dr. Elmon Kirschner, will get report for review, 5 year repeat per pt, [facility] colon cancer (father), poylps in both parents  EGD: 11/18 Dr Elmon Kirschner, mild gastritis per pt  Pap:??Summer 2019, Dr. Iona Coach, will get report   Mammogram:??Summer 2019,  [facility], will get report  Dexa:??not yet needed  Tdap:??03/06/16??  Pneumovax:??not yet needed  Zostavax:??01/25/16  Shingrix: 1st round completed,  Flu shot:??10/19   Eye exam: Winter 2019   Hep C screen: 12/17, negative  Lipids:??12/18 LDL 106  EKG:??03/06/16, nsr??    Patient Active Problem List    Diagnosis Date Noted   ??? Pure hypercholesterolemia 03/15/2017     Current Outpatient Medications   Medication Sig Dispense Refill   ??? gabapentin (NEURONTIN) 100 mg capsule TAKE 3 CAPSULES BY MOUTH EVERY EVENING  90 Cap 0   ??? atorvastatin (LIPITOR) 20 mg tablet TAKE 1 TABLET BY MOUTH ONE TIME DAILY 90 Tab 0   ??? raNITIdine (ZANTAC) 150 mg tablet Take 150 mg by mouth two (2) times a day.     ??? calcium carbonate (TUMS) 200 mg calcium (500 mg) chew Take 1 Tab by mouth daily.     ??? Omeprazole delayed release (PRILOSEC D/R) 20 mg tablet Take 20 mg by mouth daily.     ??? naproxen sodium (ALEVE) 220 mg tablet Take 500 mg by mouth as needed.     ??? FEXOFENADINE HCL (WAL-FEX ALLERGY PO) Take  by mouth daily as needed.       Past Surgical History:   Procedure Laterality Date   ??? HX  KNEE ARTHROSCOPY Right    ??? HX KNEE ARTHROSCOPY     ??? HX PARTIAL HYSTERECTOMY     ??? HX TONSILLECTOMY     ??? HX WISDOM TEETH EXTRACTION        Lab Results   Component Value Date/Time    WBC 7.3 03/15/2017 11:36 AM    HGB 13.3 03/15/2017 11:36 AM    HCT 40.2 03/15/2017 11:36 AM    PLATELET 252 03/15/2017 11:36 AM    MCV 91 03/15/2017 11:36 AM     Lab Results   Component Value Date/Time    Cholesterol, total 188 03/15/2017 11:36 AM    HDL Cholesterol 63 03/15/2017 11:36 AM    LDL, calculated 106 (H) 03/15/2017 11:36 AM    Triglyceride 95 03/15/2017 11:36 AM     Lab Results   Component Value Date/Time    GFR est non-AA 73 03/15/2017 11:36 AM    GFR est AA 84 03/15/2017 11:36 AM    Creatinine 0.86 03/15/2017 11:36 AM    BUN 17 03/15/2017 11:36 AM    Sodium 142 03/15/2017 11:36 AM    Potassium 4.4 03/15/2017 11:36 AM    Chloride 104 03/15/2017 11:36 AM    CO2 20 03/15/2017 11:36 AM        Review of Systems   Respiratory: Negative for shortness of breath.    Cardiovascular: Negative for chest pain.       Physical Exam  Constitutional:       General: She is not in acute distress.     Appearance: She is well-developed. She is not diaphoretic.   HENT:      Head: Normocephalic and atraumatic.      Right Ear: Tympanic membrane, ear canal and external ear normal.      Left Ear: Tympanic membrane, ear canal and external ear normal.      Mouth/Throat:      Mouth: Mucous membranes are moist.      Pharynx: Oropharynx is clear. No oropharyngeal exudate or posterior oropharyngeal erythema.   Eyes:      General: No scleral icterus.        Right eye: No discharge.         Left eye: No discharge.      Conjunctiva/sclera: Conjunctivae normal.  Pupils: Pupils are equal, round, and reactive to light.   Neck:      Musculoskeletal: Normal range of motion and neck supple.      Vascular: No carotid bruit.   Cardiovascular:      Rate and Rhythm: Normal rate and regular rhythm.      Heart sounds: Normal heart sounds. No murmur. No friction  rub. No gallop.    Pulmonary:      Effort: Pulmonary effort is normal. No respiratory distress.      Breath sounds: Normal breath sounds. No wheezing or rales.   Chest:      Chest wall: No tenderness.   Abdominal:      General: There is no distension.      Palpations: Abdomen is soft. There is no mass.      Tenderness: There is no tenderness. There is no guarding or rebound.      Hernia: No hernia is present.   Musculoskeletal: Normal range of motion.         General: No tenderness or deformity.      Right lower leg: No edema.      Left lower leg: No edema.   Lymphadenopathy:      Cervical: No cervical adenopathy.   Skin:     General: Skin is warm and dry.      Coloration: Skin is not pale.      Findings: No erythema or rash.   Neurological:      Mental Status: She is alert and oriented to person, place, and time.      Coordination: Coordination normal.   Psychiatric:         Behavior: Behavior normal.         ASSESSMENT and PLAN    ICD-10-CM ICD-9-CM    1. Physical exam, annual            Nl wt nl bp labs order flu shot shingrix utd mammo and pap utd will get report, colo utd  Z00.00 V70.0    2. Itch                  Improved with gabapentin 300 mg qhs  L29.9 698.9 gabapentin (NEURONTIN) 300 mg capsule      LIPID PANEL      METABOLIC PANEL, COMPREHENSIVE      CBC W/O DIFF      TSH 3RD GENERATION   3. Lung nodule                  Ct chest is scheduled for end of this month  R91.1 793.11 CT CHEST W CONT      LIPID PANEL      METABOLIC PANEL, COMPREHENSIVE      CBC W/O DIFF      TSH 3RD GENERATION   4. Adrenal nodule (HCC)            Ct scheduled for end of this month follows with dr Salley Scarlet lab eval was unremarkable  E27.9 255.8 CT ABD PELV W CONT      LIPID PANEL      METABOLIC PANEL, COMPREHENSIVE      CBC W/O DIFF      TSH 3RD GENERATION   5. Mixed hyperlipidemia                    Controlled on lipitor, alternates between 10 mg and 20 mg  E78.2 272.2 LIPID PANEL      METABOLIC PANEL, COMPREHENSIVE  CBC W/O  DIFF      TSH 3RD GENERATION   6. Urinary frequency                        UA unremarkable will send for culture will only tx if culture positive  R35.0 788.41         Depression screen reviewed and negative.     Scribed by Laurin Coderhea Kapania of [employer], as dictated by Dr. Roney MansJennifer Tavaris Eudy.     Current diagnosis and concerns discussed with pt at length. Pt understands risks and benefits or current treatment plan and medications, and accepts the treatment and medication with any possible risks. Pt asks appropriate questions, which were answered. Pt was instructed to call with any concerns or problems.      I have reviewed the note documented by the scribe.  The services provided are my own.  The documentation is accurate

## 2018-02-25 LAB — COMPREHENSIVE METABOLIC PANEL
ALT: 13 IU/L (ref 0–32)
AST: 14 IU/L (ref 0–40)
Albumin/Globulin Ratio: 2.2 NA (ref 1.2–2.2)
Albumin: 4.4 g/dL (ref 3.6–4.8)
Alkaline Phosphatase: 94 IU/L (ref 39–117)
BUN: 13 mg/dL (ref 8–27)
Bun/Cre Ratio: 16 NA (ref 12–28)
CO2: 23 mmol/L (ref 20–29)
Calcium: 9.3 mg/dL (ref 8.7–10.3)
Chloride: 100 mmol/L (ref 96–106)
Creatinine: 0.82 mg/dL (ref 0.57–1.00)
EGFR IF NonAfrican American: 76 mL/min/{1.73_m2} (ref >59)
GFR African American: 88 mL/min/{1.73_m2} (ref >59)
Globulin, Total: 2 g/dL (ref 1.5–4.5)
Glucose: 77 mg/dL (ref 65–99)
Potassium: 4.3 mmol/L (ref 3.5–5.2)
Sodium: 138 mmol/L (ref 134–144)
Total Bilirubin: 0.4 mg/dL (ref 0.0–1.2)
Total Protein: 6.4 g/dL (ref 6.0–8.5)

## 2018-02-25 LAB — CBC
Hematocrit: 41.7 % (ref 34.0–46.6)
Hemoglobin: 14.2 g/dL (ref 11.1–15.9)
MCH: 30.7 pg (ref 26.6–33.0)
MCHC: 34.1 g/dL (ref 31.5–35.7)
MCV: 90 fL (ref 79–97)
Platelets: 264 10*3/uL (ref 150–450)
RBC: 4.62 x10E6/uL (ref 3.77–5.28)
RDW: 12.5 % (ref 12.3–15.4)
WBC: 7.3 10*3/uL (ref 3.4–10.8)

## 2018-02-25 LAB — TSH 3RD GENERATION
TSH: 1.21 u[IU]/mL (ref 0.450–4.500)
TSH: 1.21 u[IU]/mL (ref 0.450–4.500)

## 2018-02-25 LAB — LIPID PANEL
Cholesterol, Total: 188 mg/dL (ref 100–199)
Cholesterol, total: 188 mg/dL (ref 100–199)
HDL Cholesterol: 52 mg/dL (ref >39)
HDL: 52 mg/dL (ref >39)
LDL Calculated: 108 mg/dL — ABNORMAL HIGH (ref 0–99)
LDL, calculated: 108 mg/dL — ABNORMAL HIGH (ref 0–99)
Triglyceride: 139 mg/dL (ref 0–149)
Triglycerides: 139 mg/dL (ref 0–149)
VLDL Cholesterol Calculated: 28 mg/dL (ref 5–40)
VLDL, calculated: 28 mg/dL (ref 5–40)

## 2018-02-25 LAB — CBC W/O DIFF
HCT: 41.7 % (ref 34.0–46.6)
HGB: 14.2 g/dL (ref 11.1–15.9)
MCH: 30.7 pg (ref 26.6–33.0)
MCHC: 34.1 g/dL (ref 31.5–35.7)
MCV: 90 fL (ref 79–97)
PLATELET: 264 10*3/uL (ref 150–450)
RBC: 4.62 x10E6/uL (ref 3.77–5.28)
RDW: 12.5 % (ref 12.3–15.4)
WBC: 7.3 10*3/uL (ref 3.4–10.8)

## 2018-02-25 LAB — METABOLIC PANEL, COMPREHENSIVE
A-G Ratio: 2.2 (ref 1.2–2.2)
ALT (SGPT): 13 IU/L (ref 0–32)
AST (SGOT): 14 IU/L (ref 0–40)
Albumin: 4.4 g/dL (ref 3.6–4.8)
Alk. phosphatase: 94 IU/L (ref 39–117)
BUN/Creatinine ratio: 16 (ref 12–28)
BUN: 13 mg/dL (ref 8–27)
Bilirubin, total: 0.4 mg/dL (ref 0.0–1.2)
CO2: 23 mmol/L (ref 20–29)
Calcium: 9.3 mg/dL (ref 8.7–10.3)
Chloride: 100 mmol/L (ref 96–106)
Creatinine: 0.82 mg/dL (ref 0.57–1.00)
GFR est AA: 88 mL/min/{1.73_m2} (ref >59)
GFR est non-AA: 76 mL/min/{1.73_m2} (ref >59)
GLOBULIN, TOTAL: 2 g/dL (ref 1.5–4.5)
Glucose: 77 mg/dL (ref 65–99)
Potassium: 4.3 mmol/L (ref 3.5–5.2)
Protein, total: 6.4 g/dL (ref 6.0–8.5)
Sodium: 138 mmol/L (ref 134–144)

## 2018-02-26 ENCOUNTER — Encounter

## 2018-02-26 LAB — CULTURE, URINE

## 2018-02-26 MED ORDER — ATORVASTATIN 20 MG TAB
20 mg | ORAL_TABLET | ORAL | 0 refills | Status: AC
Start: 2018-02-26 — End: ?

## 2018-02-26 MED ORDER — GABAPENTIN 300 MG CAP
300 mg | ORAL_CAPSULE | Freq: Every evening | ORAL | 2 refills | Status: DC
Start: 2018-02-26 — End: 2018-06-26

## 2018-02-26 NOTE — Telephone Encounter (Signed)
Medication pended in refill encounter, closing telephone encounter.

## 2018-02-26 NOTE — Telephone Encounter (Signed)
Pt scheduled for end of December, 2019.

## 2018-02-27 NOTE — Telephone Encounter (Signed)
The following prescription was called into pt's local pharmacy:     gabapentin (NEURONTIN) 300 mg capsule [[id number]][[id number]]   ?? Order Details   Dose: 300 mg Route: Oral Frequency: EVERY BEDTIME   Dispense Quantity: 90 Cap Refills: 2 Fills remaining: --    ??      Sig: Take 1 Cap by mouth nightly. Max Daily Amount: 300 mg.   ??      Written Date: 02/26/18 Expiration Date: --     Start Date: 02/26/18 End Date: --     ??      Ordering Provider: -- DEA #:  -- NPI:  --    Authorizing Provider: Bronwen BettersSmith, Jennifer G, MD DEA #:  [id number] NPI:  [id number]    Ordering User:  Bronwen BettersSmith, Jennifer G, MD            Diagnosis Association: Itch (L29.9)      Original Order:  gabapentin (NEURONTIN) 300 mg capsule [[id number]][[id number]]      Pharmacy:  [id number] - [place], [place] - [address] DEA #:  [id number]:  [id number]

## 2018-03-10 NOTE — Telephone Encounter (Signed)
Spoke to [place] at [facility] to provide the auth number for the CT Abd--informed that CT Chest denied.

## 2018-03-10 NOTE — Telephone Encounter (Signed)
Called, spoke to pt.  Two pt identifiers confirmed.   Pt informed per Dr. Katrinka BlazingSmith that the CT ABD was approved but not the CT Chest.   Pt verbalized understanding of information discussed w/ no further questions at this time.

## 2018-03-10 NOTE — Telephone Encounter (Addendum)
Spoke to Rallshris at [facility] to start a case for pt's pending CT Chest and Ct Abd.   All questions answered.   Thayer OhmChris states that the CT of Chest/Abd both need to have P-P done.    [facility] [phone]) for CT AB and Chest.

## 2018-03-10 NOTE — Telephone Encounter (Signed)
Called, spoke to pt.  Two pt identifiers confirmed.   Pt states that the auth for both CT's are needed by 03/12/18 for her scheduled appt 03/25/18.  Pt informed LPN RR will initiate P2P w/ insurance and callback pt w/ status.  Pt verbalized understanding of information discussed w/ no further questions at this time.

## 2018-03-10 NOTE — Telephone Encounter (Signed)
Pt states that she has a CT scheduled on 03/25/18 [facility] [phone]) and needs a prior auth.  Pt states that she would also like a call in regards to this.

## 2018-03-10 NOTE — Telephone Encounter (Addendum)
-----   Message from Frances FurbishVictoria N Hudson sent at 03/10/2018  3:09 PM EST -----  Regarding: Dr. Smith/ Telephone  Contact: [phone]  Caller's first and last name: Misty StanleyLisa with [employer] Authorization   Reason for call: Pt needs a referral order authorization sent to [facility] for 2 CT Scans for Abdominal and chest. The fax number is [phone]. Please verify with the pt as soon as process is done.  Callback required yes/no and why: yes   Best contact number(s): [phone]  Details to clarify the request: This is the Imaging Center number [phone]      Copy/paste envera

## 2018-03-10 NOTE — Telephone Encounter (Addendum)
Spoke to Debbie at [facility] to do a P2P review w/ Dr. Katrinka BlazingSmith for pt's CT scans.  Dr. Katrinka BlazingSmith states that after talking with Eunice BlaseDebbie, only the CT Abd was approved and NOT the CT Chest.   Approval #:[id number].  Will inform pt and [facility].

## 2018-03-11 NOTE — Telephone Encounter (Signed)
-----   Message from Marveen ReeksMilan S Edwards sent at 03/11/2018  4:14 PM EST -----  Regarding: Roney MansSmith, Jennifer MD/Telephone  General Message/Vendor Calls    Caller's first and last name: [facility] [facility]      Reason for call: Billing Confirmation      Callback required yes/no and why: Yes      Best contact number(s): [phone]      Details to clarify the request: Chest CT scan denied. Requesting alternative testing that would be covered.      Milan Lily LovingsS Edwards

## 2018-04-18 ENCOUNTER — Encounter

## 2018-06-26 ENCOUNTER — Encounter

## 2018-06-27 MED ORDER — GABAPENTIN 300 MG CAP
300 mg | ORAL_CAPSULE | Freq: Every evening | ORAL | 0 refills | Status: AC
Start: 2018-06-27 — End: ?

## 2019-03-16 ENCOUNTER — Encounter: Attending: Internal Medicine

## 2023-07-06 IMAGING — MR MRI LEFT SHOULDER WITHOUT CONTRAST
5 of 6 series · 28 of 40 positions shown · IV contrast (gadolinium)
Comparison: Radiographs of 05/23/2023.

________________________________________________________________________________________________ 
MRI LEFT SHOULDER WITHOUT CONTRAST, 07/06/2023 [DATE]: 
CLINICAL INDICATION: Left shoulder pain rating down left arm and into neck and 
head since 04/10/2023. Arm pulling injury.
TECHNIQUE: Multiplanar, multiecho position MR images of the shoulder were 
performed without intravenous gadolinium enhancement.

[Series 201: survey left · axial · left · 10.0mm · 0.71mm/px · z∈[-40,+125]mm · 5 of 15 slices shown]
[im 1/15]
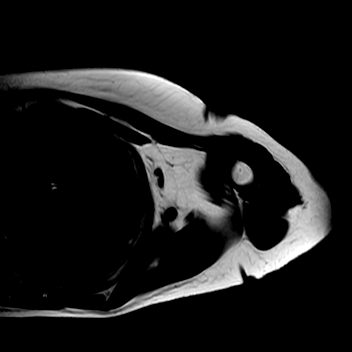
[im 4/15]
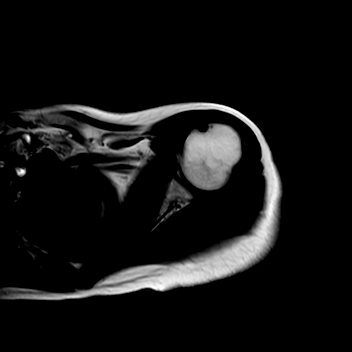
[im 8/15]
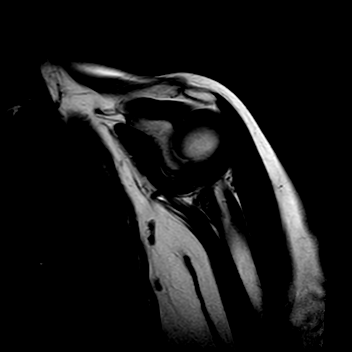
[im 11/15]
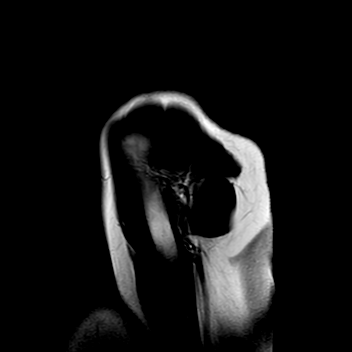
[im 15/15]
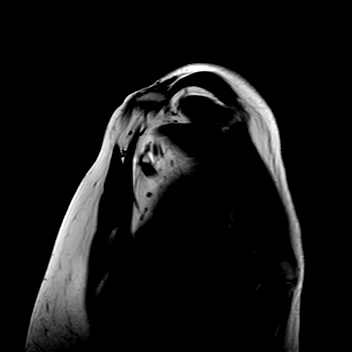

[Series 301: (person_name)_(person_name)_(person_name) · axial · left · 3.5mm · 0.42mm/px · z∈[-48,+46]mm · 7 of 25 slices shown]
[im 1/25]
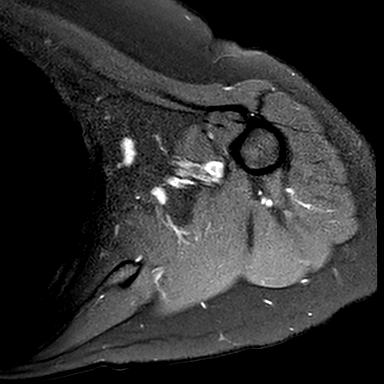
[im 5/25]
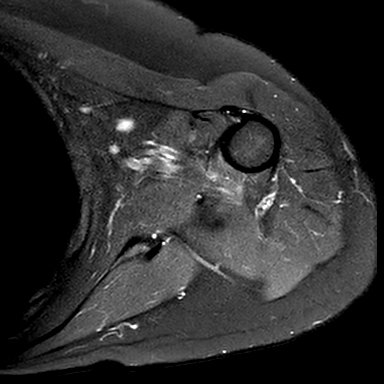
[im 9/25]
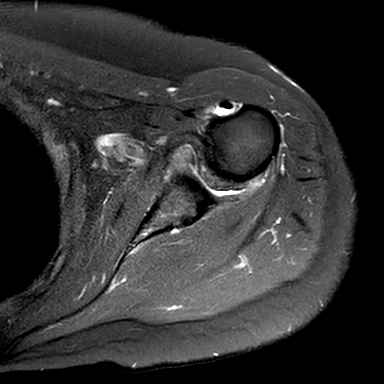
[im 13/25]
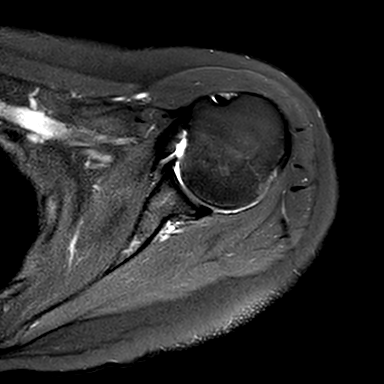
[im 17/25]
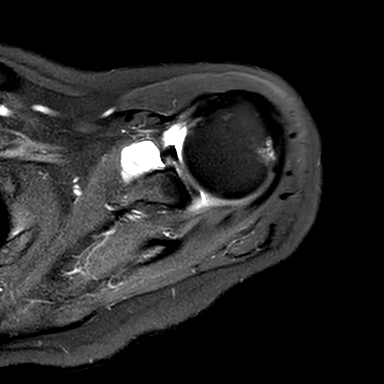
[im 21/25]
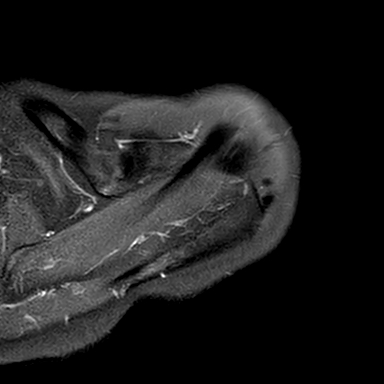
[im 25/25]
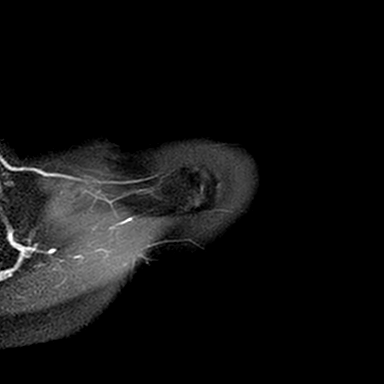

[Series 401: t2_fs_sag left · sagittal · left · 3.5mm · 0.37mm/px · 8 of 30 slices shown]
[im 1/30]
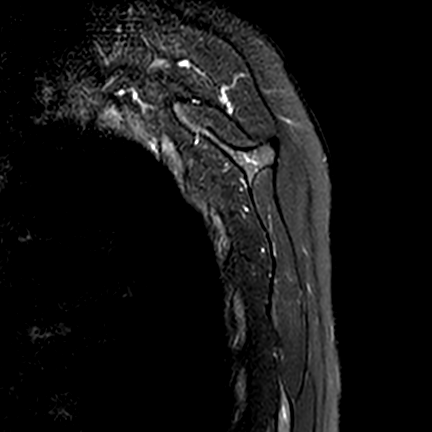
[im 5/30]
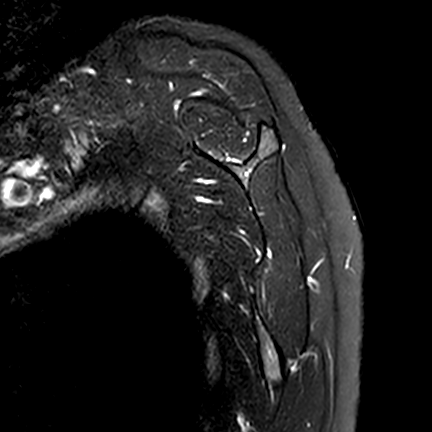
[im 9/30]
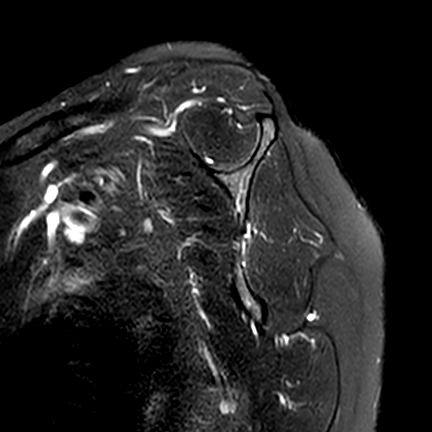
[im 13/30]
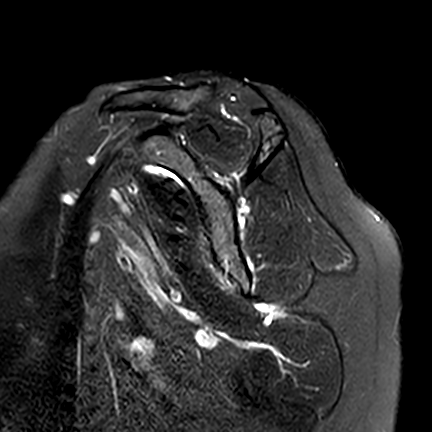
[im 17/30]
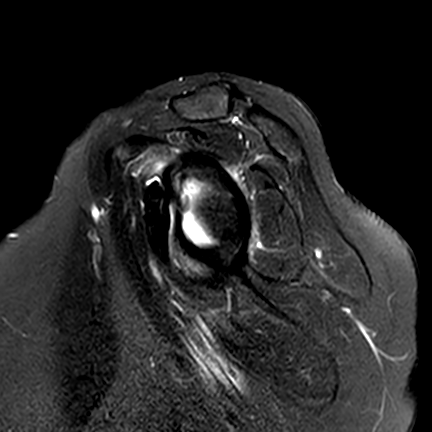
[im 21/30]
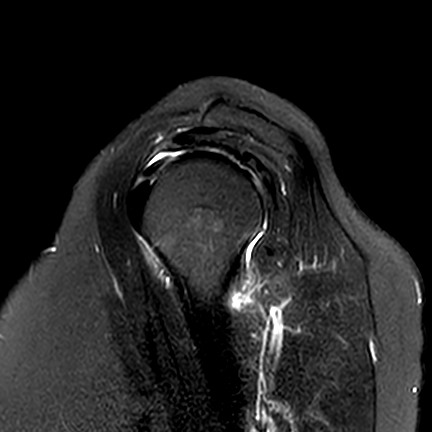
[im 25/30]
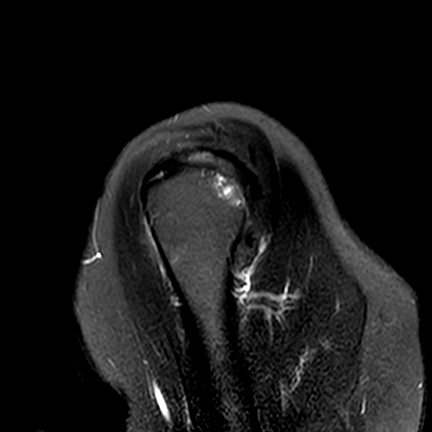
[im 30/30]
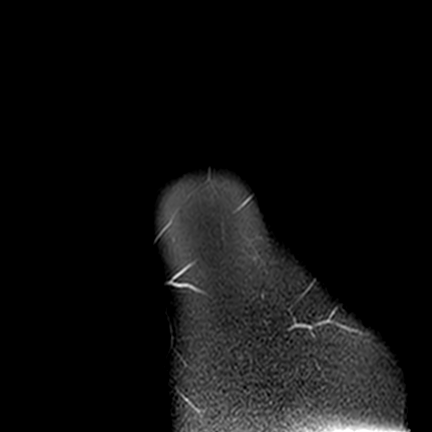

[Series 501: pd_fs_cor left · coronal · left · 3.5mm · 0.40mm/px · 6 of 24 slices shown]
[im 1/24]
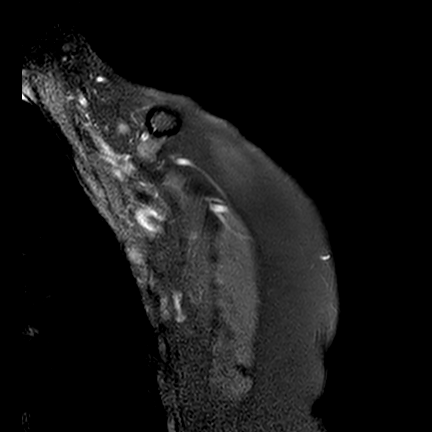
[im 5/24]
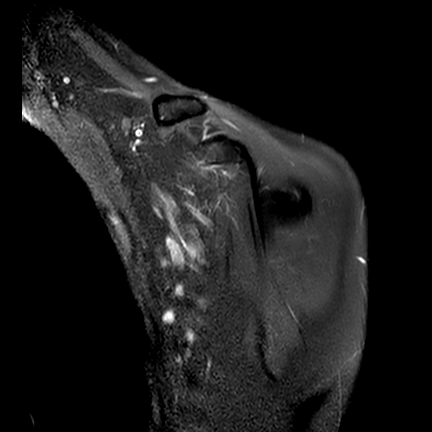
[im 10/24]
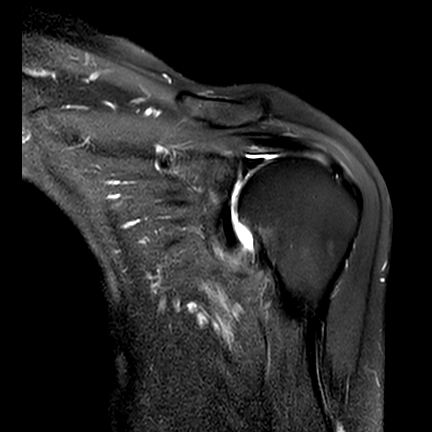
[im 14/24]
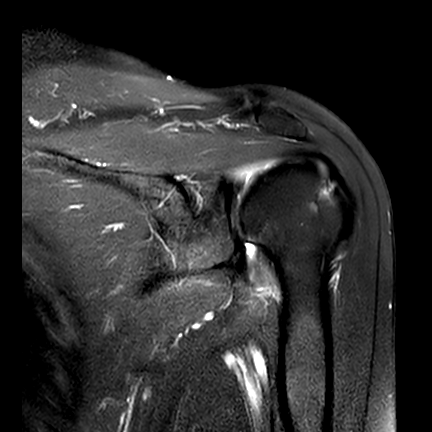
[im 19/24]
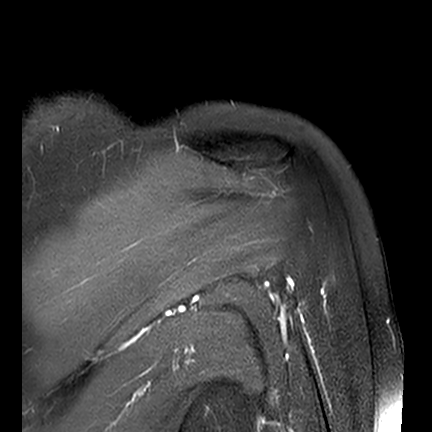
[im 24/24]
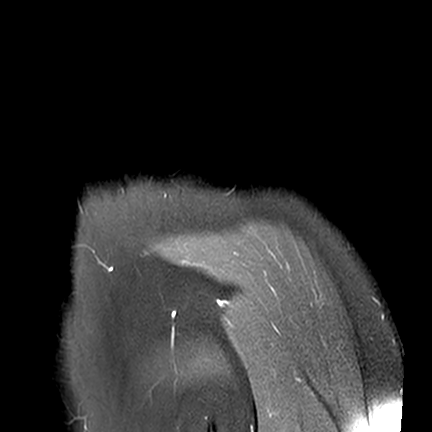

[Series 601: t1_sag left · sagittal · left · 3.5mm · 0.31mm/px · 2 of 30 slices shown]
[im 1/30]
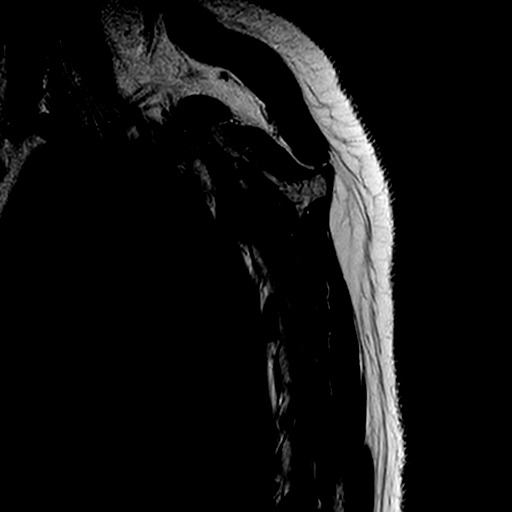
[im 5/30]
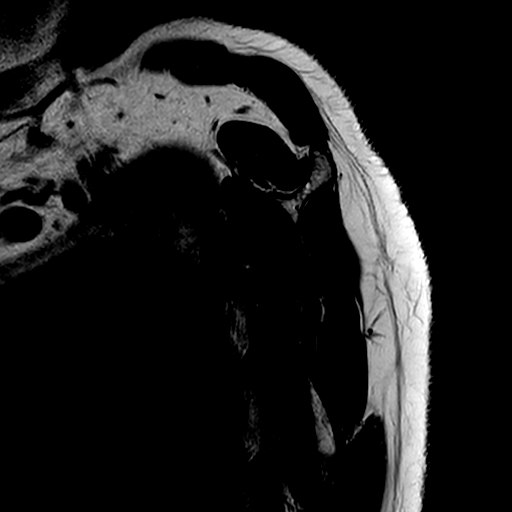

[28 of 40 positions shown; findings below may reference images not displayed]

FINDINGS: ROTATOR CUFF: The supraspinatus, infraspinatus, subscapularis and teres minor 
tendons are intact. The rotator cuff musculature is symmetric without mass, 
signal abnormality or atrophy. 
ACROMIOCLAVICULAR JOINT: Preserved. The coracoacromial ligament is intact 
without prominent spurring at the acromial attachment. The acromioclavicular and 
coracoclavicular ligaments are preserved. The acromium is normal in morphology. 
GLENOHUMERAL JOINT: The humeral head is well located within the glenoid fossa. 
Articular cartilage is preserved.  There is subtle labral foramen or hole of the 
anterosuperior labrum. The intra-articular portion of the long head of the 
biceps tendon is negative. Small shoulder joint effusion with slight synovitis. 
There is partial tearing with a thickened edematous appearance of the inferior 
capsule with partial tearing of the inferior glenohumeral ligament. 
BONES: The bone marrow signal intensity is negative for fracture. No Hill-Sachs 
defect. Subcortical cystic change of the humeral head. 
ADDITIONAL FINDINGS: The axillary region is negative. Subcutaneous tissues are 
negative.
IMPRESSION: 1.  Low-grade partial tearing of the inferior capsule and glenohumeral 
ligaments. 
2.  No rotator cuff tear.
